# Patient Record
Sex: Male | Born: 1940 | Race: White | Hispanic: No | Marital: Married | Smoking: Former smoker
Health system: Southern US, Community
[De-identification: ages and names within clinical notes are randomized; demographics above are authoritative.]

## PROBLEM LIST (undated history)

## (undated) DIAGNOSIS — M1990 Unspecified osteoarthritis, unspecified site: Secondary | ICD-10-CM

## (undated) DIAGNOSIS — I714 Abdominal aortic aneurysm, without rupture, unspecified: Secondary | ICD-10-CM

## (undated) DIAGNOSIS — I2119 ST elevation (STEMI) myocardial infarction involving other coronary artery of inferior wall: Secondary | ICD-10-CM

## (undated) DIAGNOSIS — K6139 Other ischiorectal abscess: Secondary | ICD-10-CM

## (undated) DIAGNOSIS — N200 Calculus of kidney: Secondary | ICD-10-CM

## (undated) DIAGNOSIS — I2510 Atherosclerotic heart disease of native coronary artery without angina pectoris: Principal | ICD-10-CM

## (undated) DIAGNOSIS — Z9861 Coronary angioplasty status: Principal | ICD-10-CM

## (undated) DIAGNOSIS — E119 Type 2 diabetes mellitus without complications: Secondary | ICD-10-CM

## (undated) DIAGNOSIS — I10 Essential (primary) hypertension: Secondary | ICD-10-CM

## (undated) DIAGNOSIS — E785 Hyperlipidemia, unspecified: Secondary | ICD-10-CM

## (undated) DIAGNOSIS — G40909 Epilepsy, unspecified, not intractable, without status epilepticus: Secondary | ICD-10-CM

## (undated) HISTORY — DX: Epilepsy, unspecified, not intractable, without status epilepticus: G40.909

## (undated) HISTORY — DX: ST elevation (STEMI) myocardial infarction involving other coronary artery of inferior wall: I21.19

## (undated) HISTORY — DX: Atherosclerotic heart disease of native coronary artery without angina pectoris: I25.10

## (undated) HISTORY — DX: Coronary angioplasty status: Z98.61

## (undated) HISTORY — DX: Type 2 diabetes mellitus without complications: E11.9

## (undated) HISTORY — PX: ANAL FISSURE REPAIR: SHX2312

## (undated) HISTORY — DX: Essential (primary) hypertension: I10

---

## 2010-05-11 ENCOUNTER — Encounter: Admission: RE | Admit: 2010-05-11 | Discharge: 2010-05-11 | Payer: Self-pay | Admitting: Nurse Practitioner

## 2011-06-13 IMAGING — CR DG CHEST 2V
2 series · 2 of 2 positions shown · non-contrast
Comparison: None.

CLINICAL DATA: Cough, history diabetes

CHEST - 2 VIEW

[view not recorded (1 of 2)]
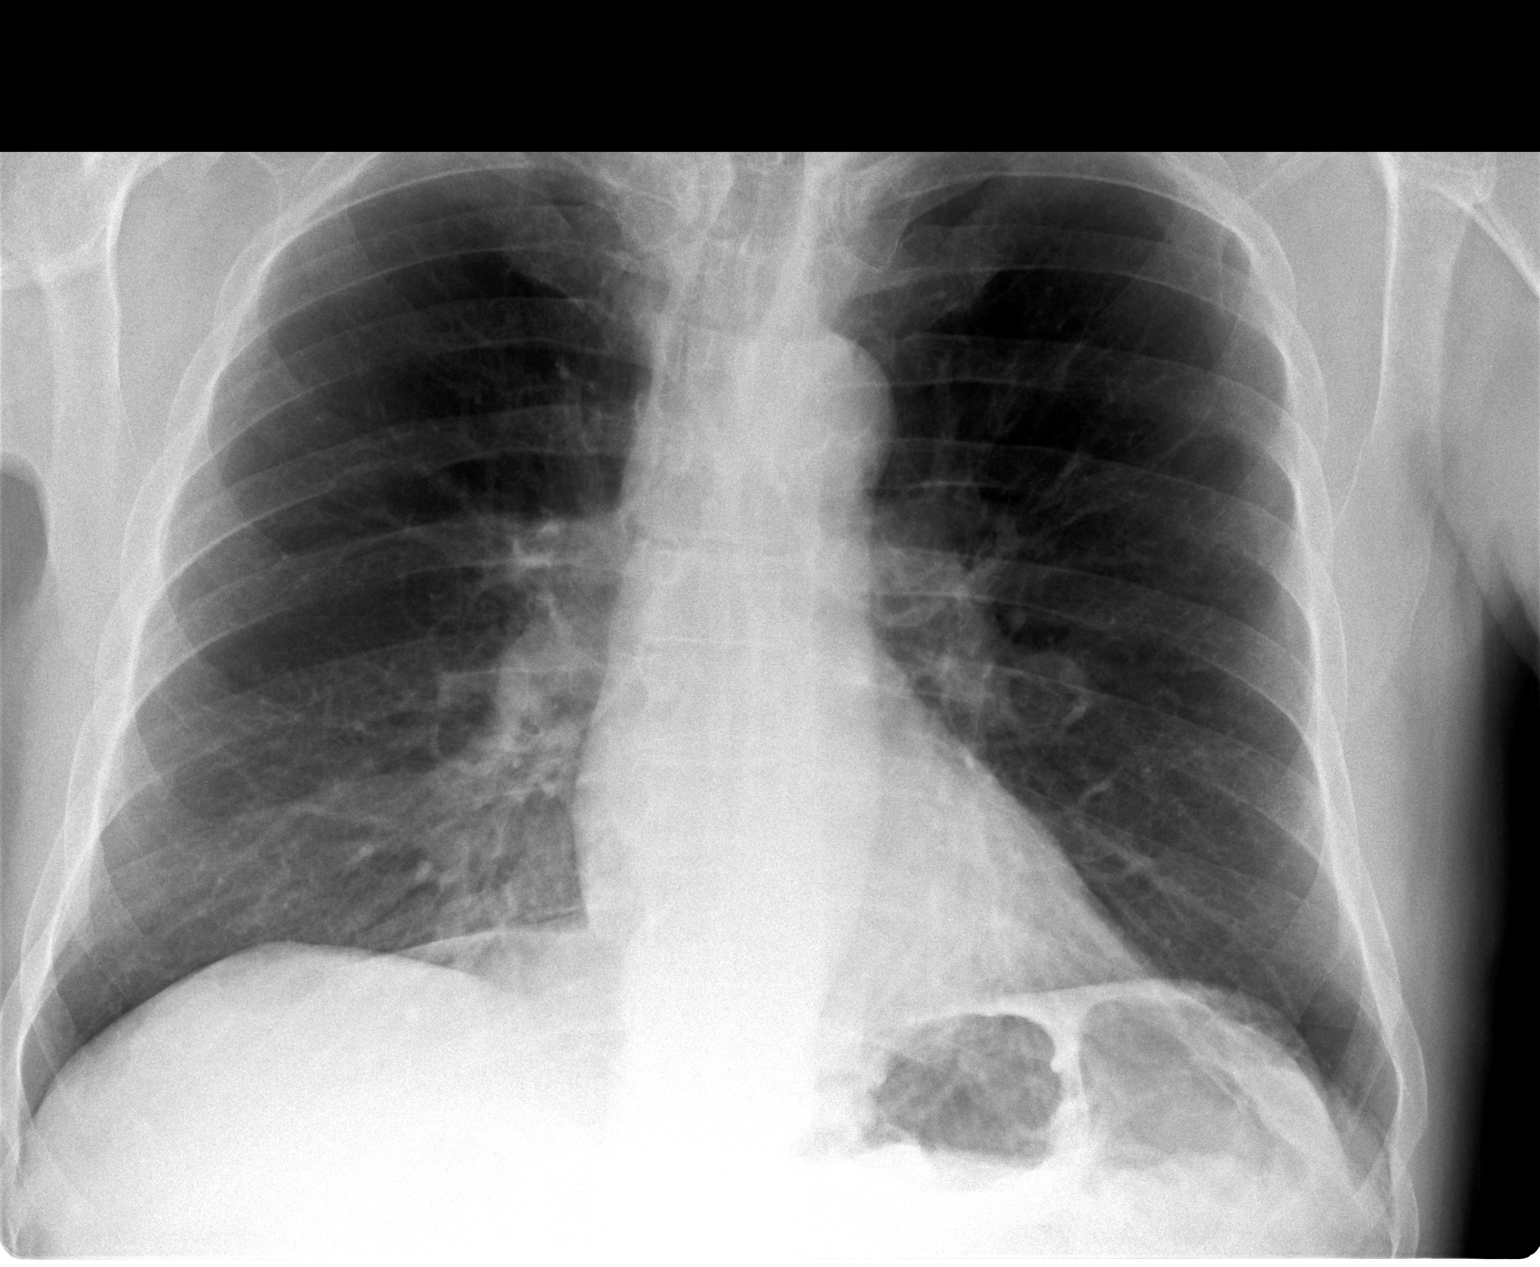

[view not recorded (2 of 2)]
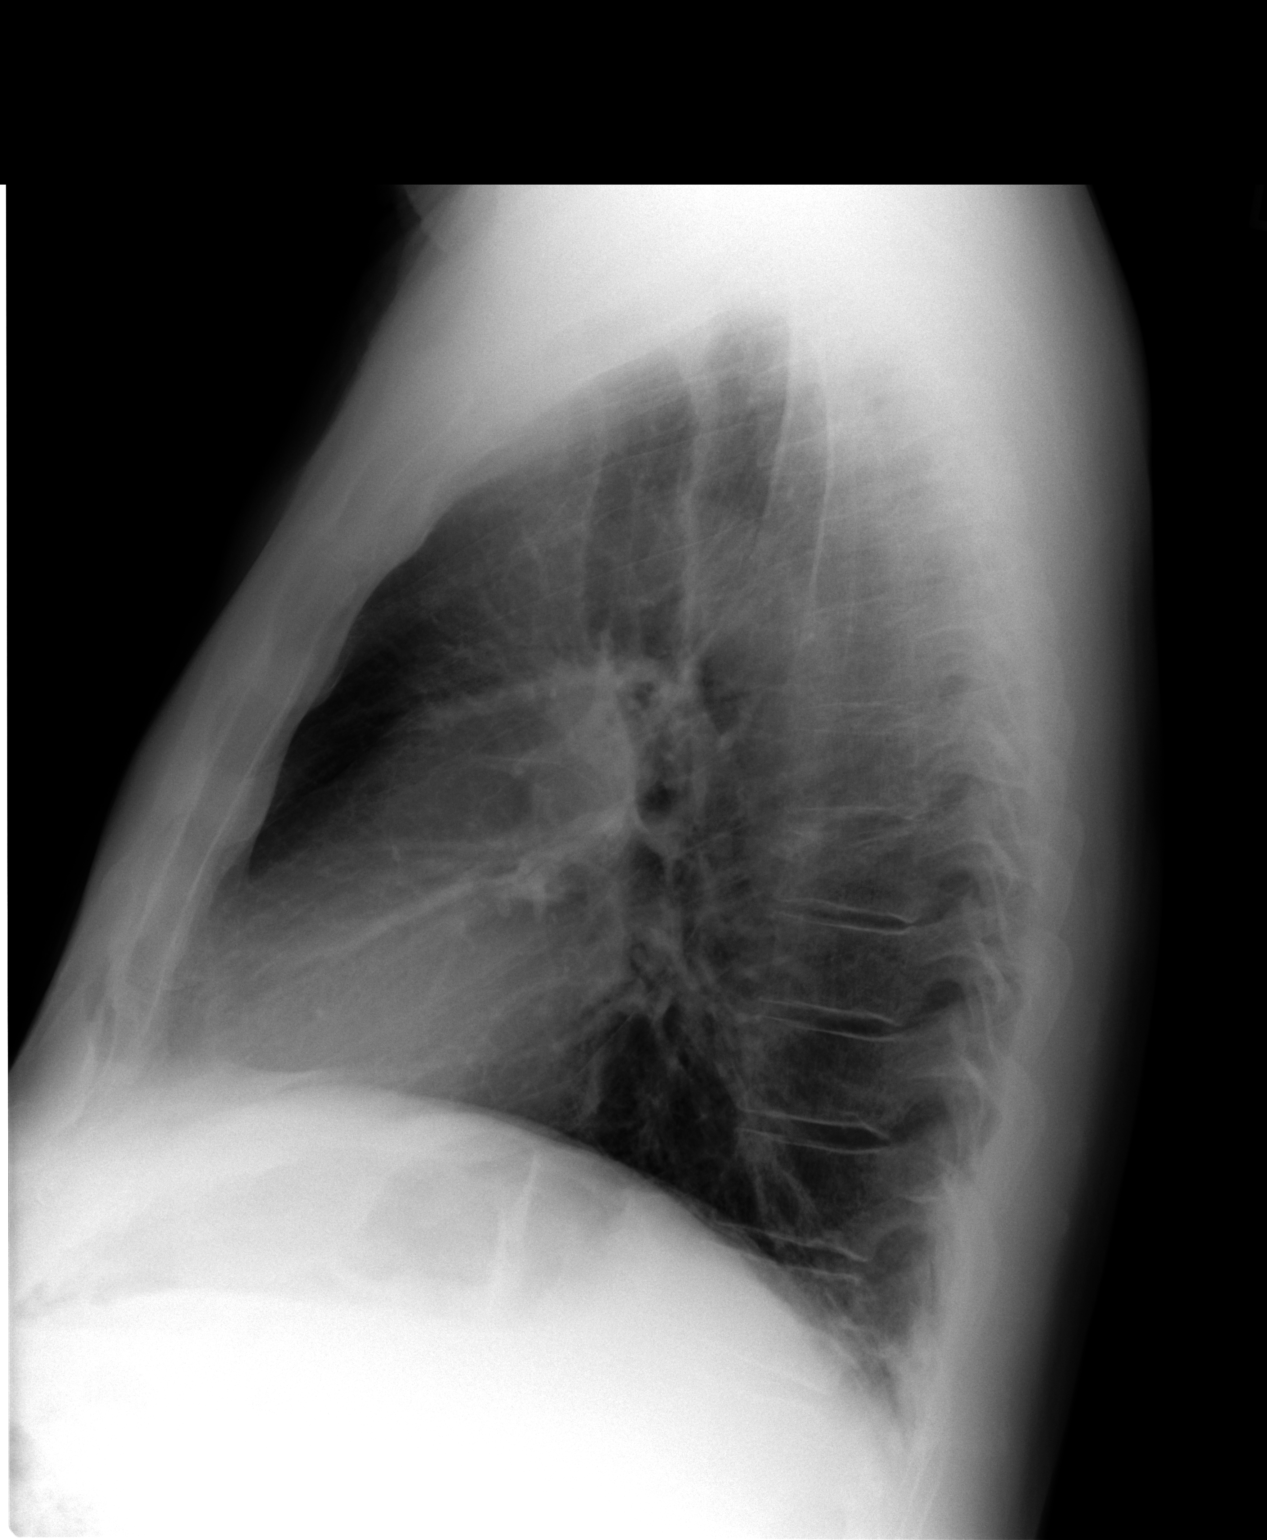

[2 of 2 positions shown; findings below may reference images not displayed]

FINDINGS: The lungs are clear.  Mediastinal contours are normal.
No bony abnormality is seen.
IMPRESSION: No active lung disease.

## 2013-11-24 ENCOUNTER — Inpatient Hospital Stay (HOSPITAL_COMMUNITY)
Admission: EM | Admit: 2013-11-24 | Discharge: 2013-11-27 | DRG: 247 | Disposition: A | Discharge status: Home / Self Care | Payer: Medicare Other | Attending: Cardiology | Admitting: Cardiology

## 2013-11-24 ENCOUNTER — Encounter (HOSPITAL_COMMUNITY): Payer: Self-pay | Admitting: Cardiology

## 2013-11-24 ENCOUNTER — Encounter (HOSPITAL_COMMUNITY)
Admission: EM | Disposition: A | Discharge status: Home / Self Care | Payer: Medicare Other | Source: Home / Self Care | Attending: Cardiology

## 2013-11-24 DIAGNOSIS — I4949 Other premature depolarization: Secondary | ICD-10-CM

## 2013-11-24 DIAGNOSIS — E119 Type 2 diabetes mellitus without complications: Secondary | ICD-10-CM

## 2013-11-24 DIAGNOSIS — Z7982 Long term (current) use of aspirin: Secondary | ICD-10-CM

## 2013-11-24 DIAGNOSIS — I2510 Atherosclerotic heart disease of native coronary artery without angina pectoris: Secondary | ICD-10-CM

## 2013-11-24 DIAGNOSIS — IMO0001 Reserved for inherently not codable concepts without codable children: Secondary | ICD-10-CM | POA: Diagnosis present

## 2013-11-24 DIAGNOSIS — I359 Nonrheumatic aortic valve disorder, unspecified: Secondary | ICD-10-CM

## 2013-11-24 DIAGNOSIS — R569 Unspecified convulsions: Secondary | ICD-10-CM | POA: Diagnosis present

## 2013-11-24 DIAGNOSIS — I493 Ventricular premature depolarization: Secondary | ICD-10-CM

## 2013-11-24 DIAGNOSIS — I2119 ST elevation (STEMI) myocardial infarction involving other coronary artery of inferior wall: Secondary | ICD-10-CM

## 2013-11-24 DIAGNOSIS — Z8249 Family history of ischemic heart disease and other diseases of the circulatory system: Secondary | ICD-10-CM

## 2013-11-24 DIAGNOSIS — Z87891 Personal history of nicotine dependence: Secondary | ICD-10-CM

## 2013-11-24 DIAGNOSIS — G40909 Epilepsy, unspecified, not intractable, without status epilepticus: Secondary | ICD-10-CM | POA: Diagnosis present

## 2013-11-24 DIAGNOSIS — E785 Hyperlipidemia, unspecified: Secondary | ICD-10-CM

## 2013-11-24 DIAGNOSIS — Z9861 Coronary angioplasty status: Secondary | ICD-10-CM

## 2013-11-24 DIAGNOSIS — Z79899 Other long term (current) drug therapy: Secondary | ICD-10-CM

## 2013-11-24 DIAGNOSIS — Z955 Presence of coronary angioplasty implant and graft: Secondary | ICD-10-CM

## 2013-11-24 DIAGNOSIS — E1165 Type 2 diabetes mellitus with hyperglycemia: Secondary | ICD-10-CM | POA: Diagnosis present

## 2013-11-24 DIAGNOSIS — I5189 Other ill-defined heart diseases: Secondary | ICD-10-CM | POA: Diagnosis present

## 2013-11-24 HISTORY — DX: Other ischiorectal abscess: K61.39

## 2013-11-24 HISTORY — DX: ST elevation (STEMI) myocardial infarction involving other coronary artery of inferior wall: I21.19

## 2013-11-24 HISTORY — DX: Calculus of kidney: N20.0

## 2013-11-24 HISTORY — PX: PERCUTANEOUS CORONARY STENT INTERVENTION (PCI-S): SHX6016

## 2013-11-24 HISTORY — DX: Coronary angioplasty status: Z98.61

## 2013-11-24 HISTORY — DX: Abdominal aortic aneurysm, without rupture, unspecified: I71.40

## 2013-11-24 HISTORY — DX: Atherosclerotic heart disease of native coronary artery without angina pectoris: I25.10

## 2013-11-24 HISTORY — PX: TRANSTHORACIC ECHOCARDIOGRAM: SHX275

## 2013-11-24 HISTORY — DX: Abdominal aortic aneurysm, without rupture: I71.4

## 2013-11-24 HISTORY — DX: Hyperlipidemia, unspecified: E78.5

## 2013-11-24 HISTORY — DX: Unspecified osteoarthritis, unspecified site: M19.90

## 2013-11-24 HISTORY — PX: CARDIAC CATHETERIZATION: SHX172

## 2013-11-24 HISTORY — PX: LEFT HEART CATHETERIZATION WITH CORONARY ANGIOGRAM: SHX5451

## 2013-11-24 LAB — MRSA PCR SCREENING: MRSA by PCR: NEGATIVE

## 2013-11-24 LAB — CBC WITH DIFFERENTIAL/PLATELET
Basophils Relative: 0 % (ref 0–1)
Eosinophils Absolute: 0.1 10*3/uL (ref 0.0–0.7)
Eosinophils Relative: 2 % (ref 0–5)
Lymphocytes Relative: 21 % (ref 12–46)
Monocytes Absolute: 0.9 10*3/uL (ref 0.1–1.0)
Monocytes Relative: 10 % (ref 3–12)
Neutro Abs: 5.9 10*3/uL (ref 1.7–7.7)
Platelets: 193 10*3/uL (ref 150–400)
RDW: 14.3 % (ref 11.5–15.5)

## 2013-11-24 LAB — POCT I-STAT, CHEM 8
BUN: 16 mg/dL (ref 6–23)
Calcium, Ion: 1.19 mmol/L (ref 1.13–1.30)
Glucose, Bld: 209 mg/dL — ABNORMAL HIGH (ref 70–99)
Potassium: 3.8 mEq/L (ref 3.5–5.1)
TCO2: 26 mmol/L (ref 0–100)

## 2013-11-24 LAB — COMPREHENSIVE METABOLIC PANEL
AST: 18 U/L (ref 0–37)
Alkaline Phosphatase: 107 U/L (ref 39–117)
CO2: 27 mEq/L (ref 19–32)
Chloride: 101 mEq/L (ref 96–112)
Creatinine, Ser: 0.75 mg/dL (ref 0.50–1.35)
Sodium: 139 mEq/L (ref 135–145)
Total Bilirubin: 0.2 mg/dL — ABNORMAL LOW (ref 0.3–1.2)
Total Protein: 6.9 g/dL (ref 6.0–8.3)

## 2013-11-24 LAB — LIPID PANEL
LDL Cholesterol: 118 mg/dL — ABNORMAL HIGH (ref 0–99)
Total CHOL/HDL Ratio: 5 RATIO

## 2013-11-24 LAB — TROPONIN I
Troponin I: 0.75 ng/mL (ref <0.30)
Troponin I: 13.63 ng/mL (ref <0.30)

## 2013-11-24 LAB — CK TOTAL AND CKMB (NOT AT ARMC): Total CK: 96 U/L (ref 7–232)

## 2013-11-24 SURGERY — LEFT HEART CATHETERIZATION WITH CORONARY ANGIOGRAM
Anesthesia: LOCAL

## 2013-11-24 MED ORDER — BIVALIRUDIN 250 MG IV SOLR
INTRAVENOUS | Status: AC
  Filled 2013-11-24: qty 250

## 2013-11-24 MED ORDER — HEPARIN SODIUM (PORCINE) 5000 UNIT/ML IJ SOLN
4000.0000 [IU] | Freq: Once | INTRAMUSCULAR | Status: AC
  Administered 2013-11-24: 4000 [IU] via INTRAVENOUS

## 2013-11-24 MED ORDER — ASPIRIN 81 MG PO CHEW
81.0000 mg | CHEWABLE_TABLET | Freq: Every day | ORAL | Status: DC
  Administered 2013-11-25 – 2013-11-27 (×3): 81 mg via ORAL
  Filled 2013-11-24 (×3): qty 1

## 2013-11-24 MED ORDER — PHENYTOIN SODIUM EXTENDED 100 MG PO CAPS
300.0000 mg | ORAL_CAPSULE | Freq: Every day | ORAL | Status: DC
  Administered 2013-11-24 – 2013-11-26 (×3): 300 mg via ORAL
  Filled 2013-11-24 (×4): qty 3

## 2013-11-24 MED ORDER — SODIUM CHLORIDE 0.9 % IJ SOLN: 3.0000 mL | INTRAMUSCULAR | Status: DC | PRN

## 2013-11-24 MED ORDER — TICAGRELOR 90 MG PO TABS
ORAL_TABLET | ORAL | Status: AC
  Filled 2013-11-24: qty 1

## 2013-11-24 MED ORDER — HEPARIN SODIUM (PORCINE) 5000 UNIT/ML IJ SOLN
INTRAMUSCULAR | Status: AC
  Filled 2013-11-24: qty 1

## 2013-11-24 MED ORDER — HEPARIN (PORCINE) IN NACL 2-0.9 UNIT/ML-% IJ SOLN
INTRAMUSCULAR | Status: AC
  Filled 2013-11-24: qty 1000

## 2013-11-24 MED ORDER — FENTANYL CITRATE 0.05 MG/ML IJ SOLN
INTRAMUSCULAR | Status: AC
  Filled 2013-11-24: qty 2

## 2013-11-24 MED ORDER — NITROGLYCERIN 0.2 MG/ML ON CALL CATH LAB
INTRAVENOUS | Status: AC
  Filled 2013-11-24: qty 1

## 2013-11-24 MED ORDER — VERAPAMIL HCL 2.5 MG/ML IV SOLN
INTRAVENOUS | Status: AC
  Filled 2013-11-24: qty 2

## 2013-11-24 MED ORDER — SODIUM CHLORIDE 0.9 % IV SOLN
INTRAVENOUS | Status: AC
  Administered 2013-11-24: 07:00:00 via INTRAVENOUS

## 2013-11-24 MED ORDER — MORPHINE SULFATE 2 MG/ML IJ SOLN
2.0000 mg | INTRAMUSCULAR | Status: DC | PRN
  Administered 2013-11-24: 2 mg via INTRAVENOUS
  Filled 2013-11-24: qty 1

## 2013-11-24 MED ORDER — METFORMIN HCL 500 MG PO TABS
1000.0000 mg | ORAL_TABLET | Freq: Two times a day (BID) | ORAL | Status: DC
  Administered 2013-11-26 – 2013-11-27 (×3): 1000 mg via ORAL
  Filled 2013-11-24 (×5): qty 2

## 2013-11-24 MED ORDER — TICAGRELOR 90 MG PO TABS
90.0000 mg | ORAL_TABLET | Freq: Two times a day (BID) | ORAL | Status: DC
  Administered 2013-11-24: 90 mg via ORAL
  Filled 2013-11-24 (×4): qty 1

## 2013-11-24 MED ORDER — SODIUM CHLORIDE 0.9 % IJ SOLN
3.0000 mL | Freq: Two times a day (BID) | INTRAMUSCULAR | Status: DC
  Administered 2013-11-24 – 2013-11-25 (×2): 3 mL via INTRAVENOUS

## 2013-11-24 MED ORDER — LIDOCAINE HCL (PF) 1 % IJ SOLN
INTRAMUSCULAR | Status: AC
  Filled 2013-11-24: qty 30

## 2013-11-24 MED ORDER — ATROPINE SULFATE 0.1 MG/ML IJ SOLN
INTRAMUSCULAR | Status: AC
  Filled 2013-11-24: qty 10

## 2013-11-24 MED ORDER — CEFAZOLIN SODIUM-DEXTROSE 2-3 GM-% IV SOLR
2.0000 g | Freq: Once | INTRAVENOUS | Status: DC
  Filled 2013-11-24: qty 50

## 2013-11-24 MED ORDER — HEPARIN SODIUM (PORCINE) 1000 UNIT/ML IJ SOLN
INTRAMUSCULAR | Status: AC
  Filled 2013-11-24: qty 1

## 2013-11-24 MED ORDER — HEPARIN SOD (PORK) LOCK FLUSH 100 UNIT/ML IV SOLN: 500.0000 [IU] | Freq: Once | INTRAVENOUS | Status: DC

## 2013-11-24 MED ORDER — ONDANSETRON HCL 4 MG/2ML IJ SOLN: 4.0000 mg | Freq: Four times a day (QID) | INTRAMUSCULAR | Status: DC | PRN

## 2013-11-24 MED ORDER — ACETAMINOPHEN 325 MG PO TABS: 650.0000 mg | ORAL_TABLET | ORAL | Status: DC | PRN

## 2013-11-24 MED ORDER — PHENYTOIN SODIUM EXTENDED 100 MG PO CAPS
200.0000 mg | ORAL_CAPSULE | Freq: Every day | ORAL | Status: DC
  Administered 2013-11-24 – 2013-11-27 (×4): 200 mg via ORAL
  Filled 2013-11-24 (×4): qty 2

## 2013-11-24 MED ORDER — SODIUM CHLORIDE 0.9 % IV SOLN: 250.0000 mL | INTRAVENOUS | Status: DC | PRN

## 2013-11-24 MED ORDER — METOPROLOL TARTRATE 25 MG PO TABS
25.0000 mg | ORAL_TABLET | Freq: Two times a day (BID) | ORAL | Status: DC
  Administered 2013-11-24 – 2013-11-26 (×5): 25 mg via ORAL
  Filled 2013-11-24 (×6): qty 1

## 2013-11-24 MED ORDER — ATORVASTATIN CALCIUM 80 MG PO TABS
80.0000 mg | ORAL_TABLET | Freq: Every day | ORAL | Status: DC
  Administered 2013-11-24 – 2013-11-26 (×3): 80 mg via ORAL
  Filled 2013-11-24 (×4): qty 1

## 2013-11-24 MED ORDER — MIDAZOLAM HCL 2 MG/2ML IJ SOLN
INTRAMUSCULAR | Status: AC
  Filled 2013-11-24: qty 2

## 2013-11-24 MED ORDER — ALUM & MAG HYDROXIDE-SIMETH 200-200-20 MG/5ML PO SUSP
30.0000 mL | ORAL | Status: DC | PRN
  Administered 2013-11-24: 30 mL via ORAL
  Filled 2013-11-24: qty 30

## 2013-11-24 NOTE — Interval H&P Note (Signed)
History and Physical Interval Note:  11/24/2013 6:32 AM  Conception Oms  has presented today for surgery, with the diagnosis of Inferior stemi  The various methods of treatment have been discussed with the patient and family. After consideration of risks, benefits and other options for treatment, the patient has consented to  Procedure(s): LEFT HEART CATHETERIZATION WITH CORONARY ANGIOGRAM (N/A)       with PCI as a surgical intervention .  The patient's history has been reviewed, patient examined, no change in status, stable for surgery.  I have reviewed the patient's chart and labs.  Questions were answered to the patient's satisfaction.     HARDING,DAVID W  Cath Lab Visit (complete for each Cath Lab visit)  Clinical Evaluation Leading to the Procedure:   ACS: yes  Non-ACS:    Anginal Classification: CCS IV  Anti-ischemic medical therapy: Minimal Therapy (1 class of medications)  Non-Invasive Test Results: No non-invasive testing performed  Prior CABG: No previous CABG

## 2013-11-24 NOTE — Progress Notes (Signed)
Utilization Review Completed.Marce Schartz T12/06/2013  

## 2013-11-24 NOTE — ED Notes (Signed)
Pt. Woke up this morning with substernal cp, and diaphoretic. Took 324 mg asa po; x 2 ntg sl. 0.4 mg. Hemodynamically stable upon arrival.  Alert and oriented upon arrival.

## 2013-11-24 NOTE — Progress Notes (Signed)
  Echocardiogram 2D Echocardiogram has been performed.  Melvin Morrow, Melvin Morrow 11/24/2013, 9:21 AM

## 2013-11-24 NOTE — ED Notes (Signed)
MD at bedside. 

## 2013-11-24 NOTE — Progress Notes (Signed)
Subjective: Still on bed rest. He denies any further CP. No SOB.   Objective: Vital signs in last 24 hours: Temp:  [98.2 F (36.8 C)] 98.2 F (36.8 C) (12/07 0700) Pulse Rate:  [35-90] 74 (12/07 1045) Resp:  [14-23] 14 (12/07 1045) BP: (89-137)/(44-82) 105/55 mmHg (12/07 1045) SpO2:  [96 %-100 %] 97 % (12/07 1045) Weight:  [186 lb 1.1 oz (84.4 kg)] 186 lb 1.1 oz (84.4 kg) (12/07 0700) Last BM Date: 11/24/13  Intake/Output from previous day:   Intake/Output this shift: Total I/O In: 215 [I.V.:215] Out: 600 [Urine:600]  Medications Current Facility-Administered Medications  Medication Dose Route Frequency Provider Last Rate Last Dose  . 0.9 %  sodium chloride infusion  250 mL Intravenous PRN Marykay Lex, MD      . 0.9 %  sodium chloride infusion   Intravenous Continuous Marykay Lex, MD 75 mL/hr at 11/24/13 [phone]    . acetaminophen (TYLENOL) tablet 650 mg  650 mg Oral Q4H PRN Marykay Lex, MD      . aspirin chewable tablet 81 mg  81 mg Oral Daily Marykay Lex, MD      . atorvastatin (LIPITOR) tablet 80 mg  80 mg Oral q1800 Marykay Lex, MD      . atropine 0.1 MG/ML injection           . ceFAZolin (ANCEF) IVPB 2 g/50 mL premix  2 g Intravenous Once Marykay Lex, MD      . heparin 5000 UNIT/ML injection           . metoprolol tartrate (LOPRESSOR) tablet 25 mg  25 mg Oral BID Marykay Lex, MD   25 mg at 11/24/13 [phone]  . morphine 2 MG/ML injection 2 mg  2 mg Intravenous Q1H PRN Marykay Lex, MD      . ondansetron [facility]) injection 4 mg  4 mg Intravenous Q6H PRN Marykay Lex, MD      . sodium chloride 0.9 % injection 3 mL  3 mL Intravenous Q12H Marykay Lex, MD      . sodium chloride 0.9 % injection 3 mL  3 mL Intravenous PRN Marykay Lex, MD      . Ticagrelor [facility]) tablet 90 mg  90 mg Oral BID Marykay Lex, MD        PE: General appearance: alert, cooperative and no distress Lungs: clear to auscultation bilaterally Heart: regular  rate and rhythm Extremities: no LEE Pulses: 2+ and symmetric Skin: warm and dry Neurologic: Grossly normal  Lab Results:   Recent Labs  11/24/13 0457 11/24/13 0518  WBC 8.7  --   HGB 13.3 13.6  HCT 39.1 40.0  PLT 193  --    BMET  Recent Labs  11/24/13 0457 11/24/13 0518  NA 139 141  K 4.1 3.8  CL 101 104  CO2 27  --   GLUCOSE 206* 209*  BUN 16 16  CREATININE 0.75 1.00  CALCIUM 9.3  --    PT/INR No results found for this basename: LABPROT, INR,  in the last 72 hours Cholesterol  Recent Labs  11/24/13 0457  CHOL 205*   Lipid Panel     Component Value Date/Time   CHOL 205* 11/24/2013 0457   TRIG 231* 11/24/2013 0457   HDL 41 11/24/2013 0457   CHOLHDL 5.0 11/24/2013 0457   VLDL 46* 11/24/2013 0457   LDLCALC 118* 11/24/2013 0457    Cardiac Panel (last 3  results)  Recent Labs  11/24/13 0457 11/24/13 0734  CKTOTAL 96  --   CKMB 7.7*  --   TROPONINI 0.75* 13.63*  RELINDX RELATIVE INDEX IS INVALID  --     Studies/Results  Emergent LHC + PCI 11/24/13 Hemodynamics:  Central Aortic / Mean Pressures: 121/59 mmHg; 88 mmHg  Left Ventricular Pressures / EDP: 122/7 mmHg; 14 mmHg Left Ventriculography:  EF: 60-65 %  Wall Motion: Distal/apical inferolateral hypokinesis Coronary Anatomy:  Left Main: Large-caliber short vessel that bifurcates into the LAD and Circumflex LAD: Large-caliber vessel that wraps the apex. There is mild intramyocardial bridging in the midportion that has the appearance of roughly 20% tubular stenosis. There is 1 major diagonal branches of the small in caliber with ostial 80% stenosis.  Left Circumflex: Large-caliber (larger than the LAD) codominant vessel it gives off a very small OM branch with an ostial 80% stenosis. Just after this branch as he goes into the AV groove there is a second marginal branch also small and diameter. At this takeoff which appeared to be posterior there is a 95-99% subtotal occlusion with a hazy filling defect  consistent with thrombus. The vessel then normalizes after the first bend were then bifurcates into a large lateral OM And the AV Groove Circumflex.  Lateral OM: Large-caliber vesselthat courses along the inferolateral wall all with the apex. Very distally it bifurcates in the superior branch which is larger the 2 there is a 95% filling defect.  AV Groove Circumflex: Bifurcates distally into 2 posterior lateral branches. RCA: Moderate caliber vessel with a tubular 30-40% mid vessel lesion. The vessel essentially terminates as the posterior descending artery giving off a small branch to the AV nodal. There is no significant right posterior lateral system.  RPDA: The vessel maintains moderate caliber tapering to small caliber vessel as it reaches almost to the apex. There diffuse luminal irregularities but no significant lesions.   Assessment/Plan  Principal Problem:   Acute MI, inferior wall, initial episode of care Active Problems:   ST elevation myocardial infarction (STEMI) of inferior wall -s/p PCI + DES to left circumflex   DM (diabetes mellitus)  Plan: S/P inferior wall STEMI, treated with PCI + DES to left circumflex. On DAPT with ASA + Brilinta. No further CP or SOB. HR and BP stable. PVCs on telemetry. On 25 mg Lopressor BID. Also on high dose Lipitor, 80 mg daily. 2D echo completed. Dr. Tresa Endo to read. Will continue to monitor in CCU.     LOS: 0 days    Brittainy M. Sharol Harness, PA-C 11/24/2013 11:11 AM   Echo Study Conclusions  - Left ventricle: The cavity size was normal. Wall thickness was normal. Systolic function was normal. The estimated ejection fraction was in the range of 55% to 60%. Probable mild hypokinesis of the mid-distal inferolateral myocardium. Doppler parameters are consistent with abnormal left ventricular relaxation (grade 1 diastolic dysfunction). Doppler parameters are consistent with elevated mean left atrial filling pressure. - Aortic valve: Mildly  calcified annulus. Mildly thickened leaflets. - Mitral valve: Mildly calcified annulus. Mildly thickened leaflets . Mild regurgitation. - Left atrium: The atrium was mildly dilated. - Right ventricle: Systolic pressure was increased. - Atrial septum: No defect or patent foramen ovale was identified. - Tricuspid valve: Mild regurgitation. - Pulmonary arteries: PA peak pressure: 32mm Hg (S). Impressions:  - The right ventricular systolic pressure was increased consistent with mild pulmonary hypertension.  Patient seen and examined. Agree with assessment and plan. Cines and echo reviewed. Large  co-dominant LCx with subtotal thrombus laden occlusion with distal inferolateral wall motion abnormality at cath corroborated on echo assessment of LV function. Anticipate good recovery of myocardium since vessel was not completely occluded.  No further chest pain, but PVC's on telemetry. Will start lopressor 25 mg bid today and titrate . Will add ace-i tomorrow. R radial and groin sites are stable. Lipid panel with atherogenic dyslipidemic pattern with inc TG and VLDL, suspect a preponderance of small LDL particle and sig increase in LDL particle number; now on lipitor 80 mg. DAPT with ASA/Brilinta.   Lennette Bihari, MD, [facility] 11/24/2013 11:39 AM time 40 minutes

## 2013-11-24 NOTE — ED Notes (Signed)
Family at bedside. 

## 2013-11-24 NOTE — H&P (Signed)
CARDIOLOGY ADMISSION NOTE  Patient ID: Melvin Morrow MRN: [id number] DOB/AGE: December 23, 1940 72 y.o.  Admit date: 11/24/2013 Primary Physician   Dr. Lindi Adie Primary Cardiologist   None Chief Complaint    Chest pain  HPI:  The patient presents as a STEMI with inferior ST elevation.  He has had no prior cardiac history.  He does have diabetes. He woke at 3 am.  He apparently had diarrhea.  He developed chest pain.  EMS was called EKG at 0430 demonstrated mild inferior ST elevation.  He was treated with SLNTG.  On transfer to the ED he had almost complete resolution of the pain.  The pain had been severe, sharp and in the left chest.  He did reported that he was diaphoretic with nausea and some dyspnea.  He did report some dark stool recently.  He has had some mild streaking of red blood on his toilet paper.  He did have anal fissure surgery recently.     Past Medical History  Diagnosis Date  . Diabetes   . Arthritis   . Seizure     Distant past    Past Surgical History  Procedure Laterality Date  . Anal fissure repair      Allergies:  Sulfa  Meds: Alprazolam  0.25 mg prn bid ASA 81 mg po daily Fluticasone 50 mcg spray 2 sprays per nostril bid Glyburide-metfromin 5-500 2 tablets bid Lovastatin 40 mg qhs Metoprolol 25 mg bid Omeprazole 20 mg daily Phenytoin 100 mg ER daily   History   Social History  . Marital Status: Married    Spouse Name: N/A    Number of Children: N/A  . Years of Education: N/A   Occupational History  . Not on file.   Social History Main Topics  . Smoking status: Former Smoker    Types: Cigarettes  . Smokeless tobacco: Not on file     Comment: Quit 25 years ago.   . Alcohol Use: Not on file  . Drug Use: Not on file  . Sexual Activity: Not on file   Other Topics Concern  . Not on file   Social History Narrative  . No narrative on file    Family History  Problem Relation Age of Onset  . CAD Father      ROS:  As stated in the HPI and  negative for all other systems.  Physical Exam: HR:  90,  BP 130/70,  96% (2 liters) GENERAL:  Well appearing HEENT:  Pupils equal round and reactive, fundi not visualized, oral mucosa unremarkable NECK:  No jugular venous distention, waveform within normal limits, carotid upstroke brisk and symmetric, no bruits, no thyromegaly LYMPHATICS:  No cervical, inguinal adenopathy LUNGS:  Clear to auscultation bilaterally BACK:  No CVA tenderness CHEST:  Unremarkable HEART:  PMI not displaced or sustained,S1 and S2 within normal limits, no S3, no S4, no clicks, no rubs, no murmurs ABD:  Flat, positive bowel sounds normal in frequency in pitch, no bruits, no rebound, no guarding, no midline pulsatile mass, no hepatomegaly, no splenomegaly EXT:  2 plus pulses upper, decreased DP/PT bilaterally, trace ankle edema, no cyanosis no clubbing SKIN:  No rashes no nodules NEURO:  Cranial nerves II through XII grossly intact, motor grossly intact throughout PSYCH:  Cognitively intact, oriented to person place and time   Labs:   Pending    Radiology:  CXR:  Pending  EKG:  NSR, frequent PACs, axis WNL, inferior and lateral  3 mm ST elevation  II, III, aVF, V4, V5, with reciprocal ST depression in the anterior leads.    ASSESSMENT AND PLAN:    ACUTE INFERIOR MI:  The patient is taken directly to the cath lab.  The patient understands that risks included but are not limited to stroke (1 in 1000), death (1 in 1000), kidney failure [usually temporary] (1 in 500), bleeding (1 in 200), allergic reaction [possibly serious] (1 in 200).  The patient understands and agrees to proceed.   DM:  Hold metfromin.  He will need sliding scall insulin.  Check A1C  HYPERLIPIDEMIA:  Substitute Lipitor 80 mg.  Fasting lipids sent.   SignedRollene Rotunda 11/24/2013, 5:18 AM

## 2013-11-24 NOTE — CV Procedure (Addendum)
CARDIAC CATHETERIZATION AND PERCUTANEOUS CORONARY INTERVENTION REPORT  NAME:  Melvin Morrow   MRN: [id number] DOB:  December 27, 1940   ADMIT DATE: 11/24/2013 Procedure Date: 11/24/2013  INTERVENTIONAL CARDIOLOGIST: Marykay Lex, M.D., MS PRIMARY CARE PROVIDER: No primary provider on file. PRIMARY CARDIOLOGIST: None  PATIENT:  Melvin Morrow is a 72 y.o. male transfer to [facility] emergency room via EMS for inferior STEMI. The patient awoke at 3:00 a.m. with severe substernal chest pressure and tightness and shortness of breath associated with nausea and dyspnea as well as diaphoresis.. His 2 sons works for the [employer] and 90 cold and they called EMS. Upon arrival ECG showed 2-3 mm elevations in the inferior and lateral leads with anteroseptal ST depressions. On initial read of the EMS in the field, there was concern for possible posterior MI. A right and posterior ECG was checked confirming that there was not evidence of posterior infarct. The patient has had a recent history of a anal fissure her surgery. He has a history of diabetes. He has noted some mild streaking of red blood on toilet paper but nothing significant. He also noted some dark stool recently but no frank bleeding.  PRE-OPERATIVE DIAGNOSIS:    Inferiorolateral STEMI  PROCEDURES PERFORMED:    LEFT HEART CATHETERIZATION WITH CORONARY ANGIOGRAPY  PERCUTANEOUS CORONARY INTERVENTION ON ~PROXIMAL CO-DOMINANT CIRCUMFLEX WITH XIENCE XPEDITION DES 3.5 MM X 23 MM (3.75 MM)  PROCEDURE:Consent:  Risks of procedure as well as the alternatives and risks of each were explained to the (patient/caregiver).  Verbal consent for procedure obtained as we were unable to obtain consent because of emergent medical necessity.  PROCEDURE: The patient was brought to the 2nd Floor [facility] in the fasting state and prepped and draped in the usual sterile fashion for Right groin or radial access. A modified  Allen's test with plethysmography was performed, revealing excellent Ulnar artery collateral flow.  Sterile technique was used including antiseptics, cap, gloves, gown, hand hygiene, mask and sheet.  Skin prep: Chlorhexidine.  Time Out: Verified patient identification, verified procedure, site/side was marked, verified correct patient position, special equipment/implants available, medications/allergies/relevent history reviewed, required imaging and test results available.  Performed  Access:   Initial attempt terminating at the Right Radial Artery, however despite excellent arterial flow after arterial puncture, the micropuncture wire was unable to be passed through safely into the vessel. After several attempts, the decision was made to abandon radial access then switch to femoral artery access. The wrist was with adequate hemostasis.  Right Common Femoral Artery; 6 Fr Sheath -- fluoroscopically guided modified Seldinger technique   Immediately following successful access, as the initial catheter was being advanced, the X-ray system failed.  Despite efforts by the staff to resolve the issues, the system would not function.  Therefore, the decision was made to transfer rooms. Careful attention was taken to maintain sterility of the equipment, access site and the operative field while the patient was transferred from the Cath Lab table to the stretcher.  The sheath was maintained with a Tegaderm dressing He was then transferred from room 6 to room 5, then transferred onto the table. He was then reprepped and draped.  The existing sheath was changed out for a new 6 French sheath to maintain sterility. New gloves were donned and the diagnostic procedure was commenced.  2 g IV Ancef was administered for precaution.  Diagnostic Left Heart Catheterization and Coronary Angiography:  JL 4, JR 4, Angled Pigtail  Left  Coronary Artery Angiography: JL4  Right Coronary Artery Angiography: JR4  LV  Hemodynamics (LV Gram): Angled Pigtail (post PCI)  Sheath:  Sutured in place, to be removed 2 hr after d/c of Angiomax Hours,   MEDICATIONS:  Anesthesia:  Local Lidocaine 2 mL for radial access, 18 mL for femoral access  Sedation:  2 mg IV Versed, 75 mcg IV fentanyl ;   Premedication: He had been given 320 mg of oral aspirin by EMS, 4000 units of IV heparin in the ER.  Omnipaque Contrast: 160 mL  Anticoagulation: Angiomax Bolus & drip - discontinued upon completion of the procedure  Anti-Platelet Agent:  Brilinta 100 mg   Ancef: 2g - given for precaution due to the need for changing tables with sheath in place.  Hemodynamics:  Central Aortic / Mean Pressures: 121/59 mmHg; 88 mmHg  Left Ventricular Pressures / EDP: 122/7 mmHg; 14 mmHg  Left Ventriculography:  EF: 60-65 %  Wall Motion: Distal/apical inferolateral hypokinesis  Coronary Anatomy:  Left Main: Large-caliber short vessel that bifurcates into the LAD and Circumflex LAD: Large-caliber vessel that wraps the apex. There is mild intramyocardial bridging in the midportion that has the appearance of roughly 20% tubular stenosis. There is 1 major diagonal branches of the small in caliber with ostial 80% stenosis.  Left Circumflex: Large-caliber (larger than the LAD) codominant vessel it gives off a very small OM branch with an ostial 80% stenosis. Just after this branch as he goes into the AV groove there is a second marginal branch also small and diameter. At this takeoff which appeared to be posterior there is a 95-99% subtotal occlusion with a hazy filling defect consistent with thrombus. The vessel then normalizes after the first bend were then bifurcates into a large lateral OM And the AV Groove Circumflex.   Lateral OM: Large-caliber vesselthat courses along the inferolateral wall all with the apex. Very distally it bifurcates in the superior branch which is larger the 2 there is a 95% filling defect.  AV Groove  Circumflex: Bifurcates distally into 2 posterior lateral branches.   RCA: Moderate caliber vessel with a tubular 30-40% mid vessel lesion. The vessel essentially terminates as the posterior descending artery giving off a small branch to the AV nodal. There is no significant right posterior lateral system.    RPDA: The vessel maintains moderate caliber tapering to small caliber vessel as it reaches almost to the apex. There diffuse luminal irregularities but no significant lesions.  Reviewing the initial angiographic data with clearly suggested that the circumflex lesion was the culprit for the inferior STEMI, thankfully the patient  remained chest pain-free from just after the heparin bolus was given in the emergency room. This was consistent with at least partial reperfusion prior to initiation of the catheterization procedure. Preparations were then made to intervene on the technically mid yet physiologically proximal Circumflex.  Percutaneous Coronary Intervention:   Lesion: Early mid codominant circumflex 95-99% subtotal thrombotic stenosis; reduced to 0%;    TIMI-2 flow pre-, TIMI-3 flow post Guide: 6 Fr   XB 3.5 Guidewire: BMW Predilation Balloon: Emerge Monorail 2.5 mm x 15 mm; 5:57 AM  8 Atm x 30 Sec Stent: Xience Xpedition DES 3.5 mm x 23 mm;   Deployed at: 12 Atm x 30 Sec,   Postdilated with stent balloon:  16 Atm x 45 Sec - final diameter: 2.75 mm Post-dilation Balloon: [place] Emerge 3.75 mm x 15 mm; proximal two thirds of stent   16 Atm x 45 Sec,  Final Diameter ~3.8 mm proximal  Post deployment angiography in multiple views, with and without guidewire in place revealed excellent stent deployment and lesion coverage.  There was no evidence of dissection or perforation.  Door to balloon time was delayed due to 2 the initial difficulty with radial access do to radial spasm. This led to the need for converting to femoral approach. No more than 5 minutes was spent attending radial  access before switching to the femoral approach. This however did not really been the significant reason for delay as it the x-ray system shutting down shortly after femoral access with clearly a cause delay regardless of access. The patient arrived to [facility] emergency room at 4:51 AM, was taken to the Cath Lab at 4:57 AM. Despite the unexpected delays, the first balloon inflation was at 5:57 AM Hetty Ely balloon time of roughly 66 minutes.   PATIENT DISPOSITION:    The patient was transferred to the PACU holding area in a hemodynamicaly stable, chest pain free condition.  The patient tolerated the procedure well, and there were no complications.  EBL:   < 20 ml  The patient was stable before, during, and after the procedure.  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 There are small branch vessels including a diagonal and a very proximal small OM with ostial stenoses.  Successful PCI of the circumflex lesion with a Xience expedition DES 3.5 mm x 20 mm (3.8 mm proximal and 3.75 mm vessel)  There is some residual stenoses very distal down the superior branch of the lateral OM that had significantly improved flow post PCI.  Well-preserved EF with minimally elevated LVEDP, distal inferolateral hypokinesis as expected.  PLAN OF CARE:  Patient will be admitted to the CCU for post catheterization care.  As he is relatively stable hemodynamic standpoint with no active symptoms, we could consider early versus fast-track discharge in 3 days.  Add beta blocker and statin.  Check 2-D Echocardiogram to confirm wall motion changes.   Marykay Lex, M.D., M.S. [facility] [address] [place], [place]  [address]  [phone]  11/24/2013 6:34 AM

## 2013-11-24 NOTE — Progress Notes (Signed)
Right femoral sheath pulled at 0945. Manual pressure held x . No blood loss noted. Patient tolerated procedure well. Vitals remained stable. Right groin remained level 0 post hemostasis. Occlusive pressure dsg applied. Patient instructed to apply pressure to groin with cough and/or sneeze and to notify RN if bleeding occurs. Patient also instructed to remain on bedrest, not to raise head or bend leg until notification by RN. RN will continue to check on site frequently until straight-leg time is discontinued.

## 2013-11-25 ENCOUNTER — Encounter (HOSPITAL_COMMUNITY): Payer: Self-pay

## 2013-11-25 DIAGNOSIS — I2119 ST elevation (STEMI) myocardial infarction involving other coronary artery of inferior wall: Principal | ICD-10-CM

## 2013-11-25 DIAGNOSIS — Z9861 Coronary angioplasty status: Secondary | ICD-10-CM

## 2013-11-25 DIAGNOSIS — E119 Type 2 diabetes mellitus without complications: Secondary | ICD-10-CM

## 2013-11-25 DIAGNOSIS — I2510 Atherosclerotic heart disease of native coronary artery without angina pectoris: Secondary | ICD-10-CM

## 2013-11-25 LAB — CBC
HCT: 35.9 % — ABNORMAL LOW (ref 39.0–52.0)
Hemoglobin: 12.3 g/dL — ABNORMAL LOW (ref 13.0–17.0)
MCH: 31.4 pg (ref 26.0–34.0)
MCV: 91.6 fL (ref 78.0–100.0)
Platelets: 180 10*3/uL (ref 150–400)
RBC: 3.92 MIL/uL — ABNORMAL LOW (ref 4.22–5.81)
RDW: 14.4 % (ref 11.5–15.5)
WBC: 7 10*3/uL (ref 4.0–10.5)

## 2013-11-25 LAB — POCT ACTIVATED CLOTTING TIME: Activated Clotting Time: 340 seconds

## 2013-11-25 LAB — BASIC METABOLIC PANEL
CO2: 26 mEq/L (ref 19–32)
Calcium: 8.5 mg/dL (ref 8.4–10.5)
Chloride: 102 mEq/L (ref 96–112)
Creatinine, Ser: 0.68 mg/dL (ref 0.50–1.35)
GFR calc Af Amer: >90 mL/min (ref >90)
Glucose, Bld: 228 mg/dL — ABNORMAL HIGH (ref 70–99)
Sodium: 135 mEq/L (ref 135–145)

## 2013-11-25 LAB — TROPONIN I
Troponin I: >20 ng/mL (ref <0.30)
Troponin I: 17.86 ng/mL (ref <0.30)

## 2013-11-25 MED ORDER — PRASUGREL HCL 10 MG PO TABS
10.0000 mg | ORAL_TABLET | Freq: Every day | ORAL | Status: DC
  Administered 2013-11-25 – 2013-11-27 (×3): 10 mg via ORAL
  Filled 2013-11-25 (×3): qty 1

## 2013-11-25 MED FILL — Sodium Chloride IV Soln 0.9%: INTRAVENOUS | Qty: 50 | Status: AC

## 2013-11-25 NOTE — Care Management Note (Addendum)
    Page 1 of 1   11/26/2013     12:20:58 PM   CARE MANAGEMENT NOTE 11/26/2013  Patient:  Melvin Morrow, Melvin Morrow   Account Number:  [id number]  Date Initiated:  11/24/2013  Documentation initiated by:  [facility]  Subjective/Objective Assessment:   adm: severe substernal chest pressure and tightness and shortness of breath:inferior STEMI     Action/Plan:   discharge planning   Anticipated DC Date:     Anticipated DC Plan:        DC Planning Services  CM consult  Medication Assistance      Choice offered to / List presented to:             Status of service:  Completed, signed off Medicare Important Message given?   (If response is "NO", the following Medicare IM given date fields will be blank) Date Medicare IM given:   Date Additional Medicare IM given:    Discharge Disposition:  HOME/SELF CARE  Per UR Regulation:  Reviewed for med. necessity/level of care/duration of stay  If discussed at Long Length of Stay Meetings, dates discussed:    Comments:  11-26-13 1216 Tomi Bamberger, RN,BSN [phone] CM did call [facility] and medication is  not available. Will order and will be in on Wednesday. Co pay will be 143.13. No further needs form Cm at this time.   12/8  0944 debbie dowell rn,bsn pt changed to effient. effient 30day free and copay assist card left in room. in donut hole. has 47.5%of cost of effient out of pocket.  11/24/13 09:05 CM met with pt in room and asked if he has a PCP.  Pt states he is regularly seen at [facility] but cannot remember the name of his PCP.  CM gave pt Brilinta book and RN in room and states she is not sure that he will be prescribed this medication but will pass on to following RNs to activate if needed.  CM will continue to monitor for discharge needs.  Freddy Jaksch, BSN, CM [phone].

## 2013-11-25 NOTE — Progress Notes (Signed)
CARDIAC REHAB PHASE I   PRE:  Rate/Rhythm: 85SR  BP:  Supine:   Sitting: 112/57  Standing:    SaO2: 98%RA  MODE:  Ambulation: 700 ft   POST:  Rate/Rhythm: 106ST  BP:  Supine:   Sitting: 127/84  Standing:    SaO2: 98%RA 0850-0942 Pt walked 700 ft on RA with asst x 1 with steady gait. Denied dizziness or CP. Stable to transfer to tele. To recliner after walk with call bell. Started ed with pt. He is very talkative and hard to redirect for ed. Reviewed NTG use, MI restrictions, CRP 2 and effient/stent. Will complete ed tomorrow in wife's presence as pt needs some re enforcement. Discussed CRP 2 and pt gave permission to refer to [place] program. Gave effient packet and discussed use with pt.    Luetta Nutting, RN BSN  11/25/2013 9:37 AM

## 2013-11-25 NOTE — Progress Notes (Signed)
Subjective: Feels great. Out of bed and in a chair. Denies any chest pain or SOB.   Objective: Vital signs in last 24 hours: Temp:  [98 F (36.7 C)-98.8 F (37.1 C)] 98 F (36.7 C) (12/08 0400) Pulse Rate:  [35-104] 62 (12/08 0100) Resp:  [13-29] 19 (12/08 0100) BP: (89-132)/(29-75) 104/40 mmHg (12/07 2338) SpO2:  [93 %-100 %] 95 % (12/08 0100) Last BM Date: 11/24/13  Intake/Output from previous day: 12/07 0701 - 12/08 0700 In: 825 [P.O.:235; I.V.:590] Out: 1600 [Urine:1600] Intake/Output this shift:    Medications Current Facility-Administered Medications  Medication Dose Route Frequency Provider Last Rate Last Dose  . 0.9 %  sodium chloride infusion  250 mL Intravenous PRN Marykay Lex, MD      . acetaminophen (TYLENOL) tablet 650 mg  650 mg Oral Q4H PRN Marykay Lex, MD      . alum & mag hydroxide-simeth (MAALOX/MYLANTA) 200-200-20 MG/5ML suspension 30 mL  30 mL Oral PRN Marykay Lex, MD   30 mL at 11/24/13 1644  . aspirin chewable tablet 81 mg  81 mg Oral Daily Marykay Lex, MD      . atorvastatin (LIPITOR) tablet 80 mg  80 mg Oral q1800 Marykay Lex, MD   80 mg at 11/24/13 1719  . ceFAZolin (ANCEF) IVPB 2 g/50 mL premix  2 g Intravenous Once Marykay Lex, MD      . Melene Muller ON 11/26/2013] metFORMIN (GLUCOPHAGE) tablet 1,000 mg  1,000 mg Oral BID WC Brittainy Simmons, PA-C      . metoprolol tartrate (LOPRESSOR) tablet 25 mg  25 mg Oral BID Marykay Lex, MD   25 mg at 11/24/13 2141  . morphine 2 MG/ML injection 2 mg  2 mg Intravenous Q1H PRN Marykay Lex, MD   2 mg at 11/24/13 1646  . ondansetron (ZOFRAN) injection 4 mg  4 mg Intravenous Q6H PRN Marykay Lex, MD      . phenytoin (DILANTIN) ER capsule 200 mg  200 mg Oral Daily Brittainy Simmons, PA-C   200 mg at 11/24/13 1507  . phenytoin (DILANTIN) ER capsule 300 mg  300 mg Oral QHS Marykay Lex, MD   300 mg at 11/24/13 2133  . prasugrel (EFFIENT) tablet 10 mg  10 mg Oral Daily Marykay Lex,  MD      . sodium chloride 0.9 % injection 3 mL  3 mL Intravenous Q12H Marykay Lex, MD   3 mL at 11/24/13 2132  . sodium chloride 0.9 % injection 3 mL  3 mL Intravenous PRN Marykay Lex, MD        PE: General appearance: alert, cooperative and no distress Lungs: clear to auscultation bilaterally Heart: regular rate and rhythm Extremities: no LEE Pulses: 2+ and symmetric Skin: warm and dry Neurologic: Grossly normal  Lab Results:   Recent Labs  11/24/13 0457 11/24/13 0518 11/25/13 0437  WBC 8.7  --  7.0  HGB 13.3 13.6 12.3*  HCT 39.1 40.0 35.9*  PLT 193  --  180   BMET  Recent Labs  11/24/13 0457 11/24/13 0518 11/25/13 0437  NA 139 141 135  K 4.1 3.8 4.4  CL 101 104 102  CO2 27  --  26  GLUCOSE 206* 209* 228*  BUN 16 16 10   CREATININE 0.75 1.00 0.68  CALCIUM 9.3  --  8.5   PT/INR No results found for this basename: LABPROT, INR,  in the last 72  hours Cholesterol  Recent Labs  11/24/13 0457  CHOL 205*   Cardiac Panel (last 3 results)  Recent Labs  11/24/13 0457 11/24/13 0734 11/24/13 1313 11/24/13 1945  CKTOTAL 96  --   --   --   CKMB 7.7*  --   --   --   TROPONINI 0.75* 13.63* >25.00* >20.00*  RELINDX RELATIVE INDEX IS INVALID  --   --   --      Studies/Results:  Post STEMI 2D echo 11/24/13 Study Conclusions  - Left ventricle: The cavity size was normal. Wall thickness was normal. Systolic function was normal. The estimated ejection fraction was in the range of 55% to 60%. Probable mild hypokinesis of the mid-distalinferolateral myocardium. Doppler parameters are consistent with abnormal left ventricular relaxation (grade 1 diastolic dysfunction). Doppler parameters are consistent with elevated mean left atrial filling pressure. - Aortic valve: Mildly calcified annulus. Mildly thickened leaflets. - Mitral valve: Mildly calcified annulus. Mildly thickened leaflets . Mild regurgitation. - Left atrium: The atrium was mildly  dilated. - Right ventricle: Systolic pressure was increased. - Atrial septum: No defect or patent foramen ovale was identified. - Tricuspid valve: Mild regurgitation. - Pulmonary arteries: PA peak pressure: 32mm Hg (S). Impressions:  - The right ventricular systolic pressure was increased consistent with mild pulmonary hypertension.   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 DES to left circumflex. No further CP or SOB. Continue DAPT with ASA + Brilinta. HR and BP both stable. SR on telemetry with occasional PVCs. Will continue 25 mg Lopressor BID. Also continue on statin. ? Adding a low dose ACE-I today. 2D echo yesterday revealed normal systolic function with an EF of 55-60% and probable mild hypokinesis of the mid-distalinferolateral myocardium. Will recheck a troponin today to ensure levels are trending below 20. Will ask cardiac rehab to assess. MD to follow.    LOS: 1 day    Brittainy M. Delmer Islam 11/25/2013 7:51 AM  I have seen and evaluated the patient this AM along with Boyce Medici, PA. I agree with her findings, examination as well as impression recommendations.  Progressing well after 1st full day post STEMI.  BP stable, but would not introduce ACE-I as yet with normal EF.  Since he has PVCs, would prefer to titrate BB dose first.  Has only had 3 doses so far, too soon to uptitrate.  On statin. & DAPT -- converted to Effient from Brilinta due to interaction with Dilantin. Ambulate today with [facility] & anticipate transfer to Tele this PM - d/c as early as tomorrow AM if  remains stable. Phase 2 [facility] c/s as OP. CM consulted for Effient.   Marykay Lex, M.D., M.S. [facility] [address] [place], [place]  [address]  [phone] Pager # [phone] 11/25/2013 8:43 AM

## 2013-11-25 NOTE — Progress Notes (Signed)
Inpatient Diabetes Program Recommendations  AACE/ADA: New Consensus Statement on Inpatient Glycemic Control (2013)  Target Ranges:  Prepandial:   less than 140 mg/dL      Peak postprandial:   less than 180 mg/dL (1-2 hours)      Critically ill patients:  140 - 180 mg/dL  Results for RONEL, RODEHEAVER (MRN [id number]) as of 11/25/2013 12:27  Ref. Range 11/24/2013 04:57 11/24/2013 05:18  Glucose Latest Range: 70-99 mg/dL 409 (H) 811 (H)  Results for OSMEL, DYKSTRA (MRN [id number]) as of 11/25/2013 12:27  Ref. Range 11/25/2013 04:37  Glucose Latest Range: 70-99 mg/dL 213 (H)    Inpatient Diabetes Program Recommendations Correction (SSI): consider adding Novolog moderate scale TID + HS scale  HgbA1C: order to assess prehospital glucose control Will follow. Thank you  Piedad Climes BSN, RN,CDE Inpatient Diabetes Coordinator [phone] (team pager)

## 2013-11-26 LAB — GLUCOSE, CAPILLARY
Glucose-Capillary: 214 mg/dL — ABNORMAL HIGH (ref 70–99)
Glucose-Capillary: 167 mg/dL — ABNORMAL HIGH (ref 70–99)
Glucose-Capillary: 149 mg/dL — ABNORMAL HIGH (ref 70–99)

## 2013-11-26 LAB — HEMOGLOBIN A1C: Mean Plasma Glucose: 171 mg/dL — ABNORMAL HIGH (ref <117)

## 2013-11-26 MED ORDER — GLIPIZIDE ER 5 MG PO TB24
5.0000 mg | ORAL_TABLET | Freq: Every day | ORAL | Status: DC
  Administered 2013-11-27: 5 mg via ORAL
  Filled 2013-11-26 (×2): qty 1

## 2013-11-26 MED ORDER — METOPROLOL TARTRATE 25 MG PO TABS
37.5000 mg | ORAL_TABLET | Freq: Two times a day (BID) | ORAL | Status: DC
  Administered 2013-11-26 – 2013-11-27 (×2): 37.5 mg via ORAL
  Filled 2013-11-26 (×3): qty 1

## 2013-11-26 NOTE — Progress Notes (Signed)
Pt. Seen and examined. Agree with the NP/PA-C note as written. He is doing well, wants to go home. Unfortunately he is still having PVC's. His BG is not well-controlled either. We are awaiting an A1c. He reports he was on metformin and Januvia (but stopped Januvia due to cost and his Dr. York Spaniel he didn't need it).  I do feel like he needs better BG control, especially in the setting of STEMI.  Would recommend adding glipizide XL 5 mg daily. Increase lopressor to 37.5 mg BID.  Chrystie Nose, MD, [facility] Attending Cardiologist [facility]

## 2013-11-26 NOTE — Progress Notes (Signed)
CARDIAC REHAB PHASE I   PRE:  Rate/Rhythm: 94 SR with PVC's  BP:  Supine:   Sitting:100/60  Standing:    SaO2:   MODE:  Ambulation: 550 ft   POST:  Rate/Rhythm: 106 ST with PVC's  BP:  Supine:   Sitting: 126/52  Standing:    SaO2:  1040-1200 Pt tolerated ambulation with handheld  assist X 1. Pt a little unsteady. Pt 's wife states that he has a tremor that makes him shaky. He was able to walk 550 feet without c/o of cp or SOB. VS stable. Pt back to side of bed after walk with call light in reach. Completed MI and stent education with pt., wife and son. They voice understanding. Pt agrees to Outpt. CRP in [place], will send referral,  Melina Copa RN 11/26/2013 12:03 PM

## 2013-11-26 NOTE — Progress Notes (Signed)
Subjective: No complaints. Denies CP/SOB. He has been working with CR with little difficulty.   Objective: Vital signs in last 24 hours: Temp:  [97.7 F (36.5 C)-98.2 F (36.8 C)] 97.7 F (36.5 C) (12/09 0500) Pulse Rate:  [65-87] 87 (12/09 0500) Resp:  [20] 20 (12/09 0500) BP: (103-118)/(43-71) 112/71 mmHg (12/09 0500) SpO2:  [98 %-100 %] 98 % (12/09 0500) Last BM Date: 11/24/13  Intake/Output from previous day: 12/08 0701 - 12/09 0700 In: 840 [P.O.:840] Out: 1180 [Urine:1180] Intake/Output this shift:    Medications Current Facility-Administered Medications  Medication Dose Route Frequency Provider Last Rate Last Dose  . 0.9 %  sodium chloride infusion  250 mL Intravenous PRN Marykay Lex, MD      . acetaminophen (TYLENOL) tablet 650 mg  650 mg Oral Q4H PRN Marykay Lex, MD      . alum & mag hydroxide-simeth (MAALOX/MYLANTA) 200-200-20 MG/5ML suspension 30 mL  30 mL Oral PRN Marykay Lex, MD   30 mL at 11/24/13 1644  . aspirin chewable tablet 81 mg  81 mg Oral Daily Marykay Lex, MD   81 mg at 11/25/13 1020  . atorvastatin (LIPITOR) tablet 80 mg  80 mg Oral q1800 Marykay Lex, MD   80 mg at 11/25/13 2221  . ceFAZolin (ANCEF) IVPB 2 g/50 mL premix  2 g Intravenous Once Marykay Lex, MD      . metFORMIN (GLUCOPHAGE) tablet 1,000 mg  1,000 mg Oral BID WC Brittainy Simmons, PA-C   1,000 mg at 11/26/13 0845  . metoprolol tartrate (LOPRESSOR) tablet 25 mg  25 mg Oral BID Marykay Lex, MD   25 mg at 11/25/13 2200  . morphine 2 MG/ML injection 2 mg  2 mg Intravenous Q1H PRN Marykay Lex, MD   2 mg at 11/24/13 1646  . ondansetron (ZOFRAN) injection 4 mg  4 mg Intravenous Q6H PRN Marykay Lex, MD      . phenytoin (DILANTIN) ER capsule 200 mg  200 mg Oral Daily Brittainy Simmons, PA-C   200 mg at 11/25/13 1020  . phenytoin (DILANTIN) ER capsule 300 mg  300 mg Oral QHS Marykay Lex, MD   300 mg at 11/25/13 2220  . prasugrel (EFFIENT) tablet 10 mg  10 mg  Oral Daily Marykay Lex, MD   10 mg at 11/25/13 1032  . sodium chloride 0.9 % injection 3 mL  3 mL Intravenous Q12H Marykay Lex, MD   3 mL at 11/25/13 1000  . sodium chloride 0.9 % injection 3 mL  3 mL Intravenous PRN Marykay Lex, MD        PE: General appearance: alert, cooperative and no distress Lungs: bilateral rhonchi  Heart: regular rate and rhythm and w/ occasional PVCs Extremities: no LEE Pulses: 2+ and symmetric Skin: warm and dry Neurologic: Grossly normal  Lab Results:   Recent Labs  11/24/13 0457 11/24/13 0518 11/25/13 0437  WBC 8.7  --  7.0  HGB 13.3 13.6 12.3*  HCT 39.1 40.0 35.9*  PLT 193  --  180   BMET  Recent Labs  11/24/13 0457 11/24/13 0518 11/25/13 0437  NA 139 141 135  K 4.1 3.8 4.4  CL 101 104 102  CO2 27  --  26  GLUCOSE 206* 209* 228*  BUN 16 16 10   CREATININE 0.75 1.00 0.68  CALCIUM 9.3  --  8.5   PT/INR No results found for this basename: LABPROT,  INR,  in the last 72 hours Cholesterol  Recent Labs  11/24/13 0457  CHOL 205*    Assessment/Plan  Principal Problem:   ST elevation myocardial infarction (STEMI) of inferolateral wall, initial episode of care -s/p PCI + DES to left circumflex Active Problems:   DM (diabetes mellitus)   Dyslipidemia, goal LDL below 70   Presence of drug coated stent in left circumflex coronary artery: Xience Xpedition DSE 3.5 mm x 23 mm ( 3.75 mm)   CAD S/P percutaneous coronary angioplasty  Plan: Day 2 s/p inferolateral wall STEMI. DES to left circumflex. No further CP or SOB. Continue DAPT with ASA + Effient.  Also continue Lopressor 25 mg BID. He will need uptitration, as he continues to have frequent PVCs on telemetry. HR is controlled in the 70s. BP is stable. Continue cardiac rehab. Blood glucose has been elevated in the 200s. However, his Metformin was on hold for 48 hours post cath. This is to be resumed today. Pt was seen by diabetes coordinator who recommended checking Hgb A1C to  assess pre-hosptial glucose control. Hopefully his glucose levels will improve once Metformin is restarted. Hgb A1C has been ordered. If elevated, will refer back to PCP for better management. Possibly home later this PM or tomorrow. MD to follow.     LOS: 2 days    Brittainy M. Sharol Harness, PA-C 11/26/2013 9:25 AM

## 2013-11-27 ENCOUNTER — Inpatient Hospital Stay (HOSPITAL_COMMUNITY): Payer: Medicare Other

## 2013-11-27 DIAGNOSIS — I5189 Other ill-defined heart diseases: Secondary | ICD-10-CM | POA: Diagnosis present

## 2013-11-27 DIAGNOSIS — G40909 Epilepsy, unspecified, not intractable, without status epilepticus: Secondary | ICD-10-CM | POA: Diagnosis present

## 2013-11-27 LAB — GLUCOSE, CAPILLARY: Glucose-Capillary: 158 mg/dL — ABNORMAL HIGH (ref 70–99)

## 2013-11-27 LAB — BASIC METABOLIC PANEL WITH GFR
BUN: 13 mg/dL (ref 6–23)
CO2: 25 meq/L (ref 19–32)
Calcium: 8.5 mg/dL (ref 8.4–10.5)
Chloride: 105 meq/L (ref 96–112)
Creatinine, Ser: 0.66 mg/dL (ref 0.50–1.35)
GFR calc Af Amer: >90 mL/min
GFR calc non Af Amer: >90 mL/min
Glucose, Bld: 196 mg/dL — ABNORMAL HIGH (ref 70–99)
Potassium: 4.1 meq/L (ref 3.5–5.1)
Sodium: 138 meq/L (ref 135–145)

## 2013-11-27 MED ORDER — ATORVASTATIN CALCIUM 80 MG PO TABS: 80.0000 mg | ORAL_TABLET | Freq: Every day | ORAL | Status: DC

## 2013-11-27 MED ORDER — ACETAMINOPHEN 325 MG PO TABS: 650.0000 mg | ORAL_TABLET | ORAL | Status: AC | PRN

## 2013-11-27 MED ORDER — GLIPIZIDE ER 5 MG PO TB24: 5.0000 mg | ORAL_TABLET | Freq: Every day | ORAL | Status: DC

## 2013-11-27 MED ORDER — METOPROLOL TARTRATE 50 MG PO TABS: 50.0000 mg | ORAL_TABLET | Freq: Two times a day (BID) | ORAL | Status: DC

## 2013-11-27 MED ORDER — PRASUGREL HCL 10 MG PO TABS: 10.0000 mg | ORAL_TABLET | Freq: Every day | ORAL | Status: DC

## 2013-11-27 MED ORDER — ASPIRIN 81 MG PO CHEW: 81.0000 mg | CHEWABLE_TABLET | Freq: Every day | ORAL | Status: DC

## 2013-11-27 MED ORDER — NITROGLYCERIN 0.4 MG SL SUBL: 0.4000 mg | SUBLINGUAL_TABLET | SUBLINGUAL | Status: DC | PRN

## 2013-11-27 NOTE — Progress Notes (Signed)
Subjective:  No chest pain  Objective:  Vital Signs in the last 24 hours: Temp:  [97.4 F (36.3 C)-97.9 F (36.6 C)] 97.9 F (36.6 C) (12/10 0427) Pulse Rate:  [69-86] 79 (12/10 0427) Resp:  [18-20] 18 (12/10 0427) BP: (117-125)/(53-65) 118/65 mmHg (12/10 0427) SpO2:  [95 %-99 %] 99 % (12/10 0427)  Intake/Output from previous day:  Intake/Output Summary (Last 24 hours) at 11/27/13 0930 Last data filed at 11/26/13 1700  Gross per 24 hour  Intake    480 ml  Output    450 ml  Net     30 ml    Physical Exam: General appearance: alert, cooperative and no distress Lungs: clear to auscultation bilaterally Heart: regular rate and rhythm   Rate: 78  Rhythm: normal sinus rhythm, premature ventricular contractions (PVC) and couplets, 3 bt runs  Lab Results:  Recent Labs  11/25/13 0437  WBC 7.0  HGB 12.3*  PLT 180    Recent Labs  11/25/13 0437 11/27/13 0521  NA 135 138  K 4.4 4.1  CL 102 105  CO2 26 25  GLUCOSE 228* 196*  BUN 10 13  CREATININE 0.68 0.66    Recent Labs  11/24/13 1945 11/25/13 0836  TROPONINI >20.00* 17.86*   No results found for this basename: INR,  in the last 72 hours  Imaging: No CXR was done  Cardiac Studies:  2D Study Conclusions  - Left ventricle: The cavity size was normal. Wall thickness was normal. Systolic function was normal. The estimated ejection fraction was in the range of 55% to 60%. Probable mild hypokinesis of the mid-distalinferolateral myocardium. Doppler parameters are consistent with abnormal left ventricular relaxation (grade 1 diastolic dysfunction). Doppler parameters are consistent with elevated mean left atrial filling pressure. - Aortic valve: Mildly calcified annulus. Mildly thickened leaflets. - Mitral valve: Mildly calcified annulus. Mildly thickened leaflets . Mild regurgitation. - Left atrium: The atrium was mildly dilated. - Right ventricle: Systolic pressure was increased. - Atrial septum: No  defect or patent foramen ovale was identified. - Tricuspid valve: Mild regurgitation. - Pulmonary arteries: PA peak pressure: 32mm Hg (S). 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 Will review meds with MD. He is still having some ectopy. Consider adding ACE-I at follow up. Will ask OP DM RN to see for home teaching, (family worried about low BS with new medications). Home later today.  Corine Shelter PA-C Beeper [phone] 11/27/2013, 9:30 AM   He looks & feels good.  With PVCs still present, would prefer to simply increase Metoprolol to 50 mg bid & add ACE-I as OP if BP will tolerate.   Needs DM teaching & CXR to be done prior to d/c.  ROV with me or PA in ~2 weeks. He will also need to set up ROV appt with PCP to address DM coverage.  DAPT x 1 yr.  Has Effient card (converted to Effient to avoid interaction with Dilantin).  Continue Statin.  Marykay Lex, MD Marykay Lex, M.D., M.S. [facility] [address] [place], [place]  [address]  [phone] Pager # [phone] 11/27/2013 12:59 PM

## 2013-11-27 NOTE — Progress Notes (Signed)
Utilization review completed.  

## 2013-11-27 NOTE — Discharge Summary (Signed)
Patient ID: Melvin Morrow,  MRN: [id number], DOB/AGE: 27-Oct-1941 72 y.o.  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 He was awakened early in the morning 12/07 with chest pain. EMS was called. He had ST elevation in the inferior leads. He was taken to the cath lab by Dr Herbie Baltimore. Cath revealed mid CFX disease with a subtotal thrombotic occlusion which was intervened on with a DES. He had residual 80% small Dx and 30-40% RCA stenosis that will be treated medically. EF 60-65% at cath. Post MI he had PVCs and couplets and his beta blocker was increased. Echo confirmed his EF to be WNL. His BS was elevated and his diabetic medication was adjusted. Dr Herbie Baltimore feels he can be discharged 11/27/13.   Discharge Vitals:  Blood pressure 125/59, pulse 84, temperature 97.2 F (36.2 C), temperature source Oral, resp. rate 19, height 5\' 9"  (1.753 m), weight 186 lb 1.1 oz (84.4 kg), SpO2 99.00%.    Labs: Results for orders placed during the hospital encounter of 11/24/13 (from the past 48 hour(s))  GLUCOSE, CAPILLARY     Status: Abnormal   Collection Time    11/26/13  8:34 AM      Result Value Range   Glucose-Capillary 283 (*) 70 - 99 mg/dL   Comment 1 Notify RN     Comment 2 Documented in Chart    HEMOGLOBIN A1C     Status: Abnormal   Collection Time    11/26/13 10:50 AM      Result Value Range   Hemoglobin A1C 7.6 (*) <5.7 %   Comment: (NOTE)                                                                                According to the ADA Clinical Practice Recommendations for 2011, when     HbA1c is used as a screening test:      >=6.5%   Diagnostic of Diabetes Mellitus               (if abnormal result is confirmed)     5.7-6.4%   Increased risk of developing Diabetes Mellitus     References:Diagnosis and Classification of Diabetes Mellitus,Diabetes     Care,2011,34(Suppl 1):S62-S69 and Standards of Medical Care in             Diabetes - 2011,Diabetes Care,2011,34 (Suppl 1):S11-S61.   Mean Plasma Glucose 171 (*) <117 mg/dL   Comment: Performed at [facility]  GLUCOSE, CAPILLARY     Status: Abnormal   Collection Time    11/26/13 11:32 AM      Result Value Range   Glucose-Capillary 214 (*) 70 - 99 mg/dL   Comment  1 Notify RN     Comment 2 Documented in Chart    GLUCOSE, CAPILLARY     Status: Abnormal   Collection Time    11/26/13  4:36 PM      Result Value Range   Glucose-Capillary 167 (*) 70 - 99 mg/dL   Comment 1 Notify RN     Comment 2 Documented in Chart    GLUCOSE, CAPILLARY     Status: Abnormal   Collection Time    11/26/13  8:43 PM      Result Value Range   Glucose-Capillary 149 (*) 70 - 99 mg/dL  BASIC METABOLIC PANEL     Status: Abnormal   Collection Time    11/27/13  5:21 AM      Result Value Range   Sodium 138  135 - 145 mEq/L   Potassium 4.1  3.5 - 5.1 mEq/L   Chloride 105  96 - 112 mEq/L   CO2 25  19 - 32 mEq/L   Glucose, Bld 196 (*) 70 - 99 mg/dL   BUN 13  6 - 23 mg/dL   Creatinine, Ser 1.32  0.50 - 1.35 mg/dL   Calcium 8.5  8.4 - 44.0 mg/dL   GFR calc non Af Amer >90  >90 mL/min   GFR calc Af Amer >90  >90 mL/min   Comment: (NOTE)     The eGFR has been calculated using the CKD EPI equation.     This calculation has not been validated in all clinical situations.     eGFR's persistently <90 mL/min signify possible Chronic Kidney     Disease.  GLUCOSE, CAPILLARY     Status: Abnormal   Collection Time    11/27/13  7:33 AM      Result  Value Range   Glucose-Capillary 214 (*) 70 - 99 mg/dL   Comment 1 Documented in Chart     Comment 2 Notify RN    GLUCOSE, CAPILLARY     Status: Abnormal   Collection Time    11/27/13 12:07 PM      Result Value Range   Glucose-Capillary 158 (*) 70 - 99 mg/dL   Comment 1 Documented in Chart     Comment 2 Notify RN      Disposition:  Follow-up Information   Follow up with Abelino Derrick, PA-C On 12/11/2013. (1:40 pm)    Specialty:  Cardiology   Contact information:   [address] [place]  [place] [address] [phone]       Discharge Medications:    Medication List         acetaminophen 325 MG tablet  Commonly known as:  TYLENOL  Take 2 tablets (650 mg total) by mouth every 4 (four) hours as needed for headache or mild pain.     aspirin 81 MG chewable tablet  Chew 1 tablet (81 mg total) by mouth daily.     atorvastatin 80 MG tablet  Commonly known as:  LIPITOR  Take 1 tablet (80 mg total) by mouth daily at 6 PM.     glipiZIDE 5 MG 24 hr tablet  Commonly known as:  GLUCOTROL XL  Take 1 tablet (5 mg total) by mouth daily with breakfast.     metFORMIN 500 MG tablet  Commonly known as:  GLUCOPHAGE  Take 1,000 mg by mouth 2 (two) times daily with a meal.     metoprolol 50 MG tablet  Commonly known as:  LOPRESSOR  Take 1 tablet (50 mg total)  by mouth 2 (two) times daily.     nitroGLYCERIN 0.4 MG SL tablet  Commonly known as:  NITROSTAT  Place 1 tablet (0.4 mg total) under the tongue every 5 (five) minutes as needed for chest pain.     phenytoin 100 MG ER capsule  Commonly known as:  DILANTIN  Take 200-300 mg by mouth 2 (two) times daily. Take 2 caps ( 200mg ) in the morning and 3 caps ( 300mg ) at bedtime     prasugrel 10 MG Tabs tablet  Commonly known as:  EFFIENT  Take 1 tablet (10 mg total) by mouth daily.         Duration of Discharge Encounter: Greater than 30 minutes including physician time.  Jolene Provost PA-C 11/27/2013 1:52 PM

## 2013-11-27 NOTE — Progress Notes (Signed)
CARDIAC REHAB PHASE I   PRE:  Rate/Rhythm: 77 SR with PVC's  BP:  Supine:   Sitting: 105/50  Standing:    SaO2: 98 RA  MODE:  Ambulation: 900 ft   POST:  Rate/Rhythm: 77   BP:  Supine:   Sitting: 94/50  Standing:    SaO2: 99 RA 1125-1155  Assisted X 1 to ambulate. Gait more steady today. VS stable Pt back to side of bed after walk with call light in reach and family present.  Melina Copa RN 11/27/2013 11:53 AM

## 2013-11-27 NOTE — Discharge Summary (Signed)
Did well post Inferolateral STEMI.  CBG control was difficult due to Metformin being held.  Medications adjusted & DM counseling given.  PVCs persisted post PCI of LCx - BB dose titrated up to 50 mg bid.    On DAPT.    OK for d/c home today.  ROV as scheduled.  I agree with the D/C Summary.  Marykay Lex, M.D., M.S. [facility] [address] [place], [place]  [address]  [phone] Pager # [phone] 11/27/2013 3:51 PM

## 2013-12-11 ENCOUNTER — Encounter: Payer: Self-pay | Admitting: Cardiology

## 2013-12-11 ENCOUNTER — Ambulatory Visit: Payer: Medicare Other | Admitting: Cardiology

## 2013-12-11 ENCOUNTER — Ambulatory Visit (INDEPENDENT_AMBULATORY_CARE_PROVIDER_SITE_OTHER): Payer: Medicare Other | Admitting: Cardiology

## 2013-12-11 VITALS — BP 120/60 | HR 60 | Ht 71.0 in | Wt 185.0 lb

## 2013-12-11 DIAGNOSIS — E1165 Type 2 diabetes mellitus with hyperglycemia: Secondary | ICD-10-CM

## 2013-12-11 DIAGNOSIS — I519 Heart disease, unspecified: Secondary | ICD-10-CM

## 2013-12-11 DIAGNOSIS — I2510 Atherosclerotic heart disease of native coronary artery without angina pectoris: Secondary | ICD-10-CM

## 2013-12-11 DIAGNOSIS — IMO0001 Reserved for inherently not codable concepts without codable children: Secondary | ICD-10-CM

## 2013-12-11 DIAGNOSIS — E785 Hyperlipidemia, unspecified: Secondary | ICD-10-CM

## 2013-12-11 DIAGNOSIS — I5189 Other ill-defined heart diseases: Secondary | ICD-10-CM

## 2013-12-11 DIAGNOSIS — I2119 ST elevation (STEMI) myocardial infarction involving other coronary artery of inferior wall: Secondary | ICD-10-CM

## 2013-12-11 NOTE — Assessment & Plan Note (Signed)
Followed by PMD 

## 2013-12-11 NOTE — Patient Instructions (Signed)
Your physician recommends that you schedule a follow-up appointment in: 3 months.  

## 2013-12-11 NOTE — Assessment & Plan Note (Signed)
Doing well, no angina 

## 2013-12-11 NOTE — Assessment & Plan Note (Signed)
Medi-cal[facility]

## 2013-12-11 NOTE — Progress Notes (Signed)
12/11/2013 Conception Oms   1941/05/11  [id number]  Primary Physicia Lesia Hausen, MD, [facility] Primary Cardiologist: Dr Herbie Baltimore  HPI:  72 y/o male from [place] with a history of diabetes and dyslipidemia. He was awakened early in the morning 12/07 with chest pain. EMS was called. He had ST elevation in the inferior leads. He was taken to the cath lab by Dr Herbie Baltimore. Cath revealed mid CFX disease with a subtotal thrombotic occlusion which was intervened on with a DES. He had residual 80% small Dx and 30-40% RCA stenosis that will be treated medically. EF 60-65% at cath. Post MI he had PVCs and couplets and his beta blocker was increased. Echo confirmed his EF to be WNL.         He is here today for post hospital visit. He is doing well. He has had no chest pain. His primary care MD is follow ing up on his lipids.     Current Outpatient Prescriptions  Medication Sig Dispense Refill  . acetaminophen (TYLENOL) 325 MG tablet Take 2 tablets (650 mg total) by mouth every 4 (four) hours as needed for headache or mild pain.      Marland Kitchen aspirin 81 MG chewable tablet Chew 1 tablet (81 mg total) by mouth daily.      Marland Kitchen atorvastatin (LIPITOR) 80 MG tablet Take 1 tablet (80 mg total) by mouth daily at 6 PM.  30 tablet  11  . glyBURIDE-metformin (GLUCOVANCE) 5-500 MG per tablet Take 2 tablets by mouth 2 (two) times daily with a meal.       . metoprolol (LOPRESSOR) 50 MG tablet Take 1 tablet (50 mg total) by mouth 2 (two) times daily.  60 tablet  11  . nitroGLYCERIN (NITROSTAT) 0.4 MG SL tablet Place 1 tablet (0.4 mg total) under the tongue every 5 (five) minutes as needed for chest pain.  25 tablet  12  . omeprazole (PRILOSEC) 20 MG capsule Take 20 mg by mouth daily.      . phenytoin (DILANTIN) 100 MG ER capsule Take 200-300 mg by mouth 2 (two) times daily. Take 2 caps ( 200mg ) in the morning and 3 caps ( 300mg ) at bedtime      . prasugrel (EFFIENT) 10 MG TABS tablet Take 1 tablet (10 mg  total) by mouth daily.  30 tablet  11   No current facility-administered medications for this visit.    Allergies  Allergen Reactions  . Sulfur     History   Social History  . Marital Status: Married    Spouse Name: N/A    Number of Children: N/A  . Years of Education: N/A   Occupational History  . Not on file.   Social History Main Topics  . Smoking status: Former Smoker    Types: Cigarettes  . Smokeless tobacco: Never Used     Comment: Quit 25 years ago.   . Alcohol Use: No  . Drug Use: No  . Sexual Activity: No   Other Topics Concern  . Not on file   Social History Narrative  . No narrative on file     Review of Systems: General: negative for chills, fever, night sweats or weight changes.  Cardiovascular: negative for chest pain, dyspnea on exertion, edema, orthopnea, palpitations, paroxysmal nocturnal dyspnea or shortness of breath Dermatological: negative for rash Respiratory: negative for cough or wheezing Urologic: negative for hematuria Abdominal: negative for nausea, vomiting, diarrhea, bright red blood per rectum, melena, or hematemesis Neurologic: negative  for visual changes, syncope, or dizziness All other systems reviewed and are otherwise negative except as noted above.    Blood pressure 120/60, pulse 60, height 5\' 11"  (1.803 m), weight 185 lb (83.915 kg).  General appearance: alert, cooperative and no distress Lungs: clear to auscultation bilaterally Heart: regular rate and rhythm    ASSESSMENT AND PLAN:   STEMI 11/24/13-s/p PCI + DES to CFX  Doing well, no angina  Dyslipidemia, goal LDL below 70 Followed by PMD  Diabetes mellitus type II, uncontrolled .  Diastolic dysfunction, grade 1 with EF 50-55% 11/24/13 .  CAD- residual 40% RCA, 80% Dx Medical Rx   PLAN: Same Rx. Dr Herbie Baltimore to see in 3 months.  Siniyah Evangelist KPA-C 12/11/2013 1:15 PM

## 2013-12-17 ENCOUNTER — Telehealth: Payer: Self-pay | Admitting: *Deleted

## 2013-12-17 NOTE — Telephone Encounter (Signed)
LATE ENTRY--  FAXED ORDER FOR CARDIAC  [facility] 12/11/13

## 2013-12-25 ENCOUNTER — Other Ambulatory Visit: Payer: Self-pay | Admitting: *Deleted

## 2013-12-25 MED ORDER — GLIPIZIDE 5 MG PO TABS: 5.0000 mg | ORAL_TABLET | Freq: Every day | ORAL | Status: DC

## 2013-12-25 MED ORDER — PRASUGREL HCL 10 MG PO TABS: 10.0000 mg | ORAL_TABLET | Freq: Every day | ORAL | Status: DC

## 2013-12-25 MED ORDER — METOPROLOL TARTRATE 50 MG PO TABS: 50.0000 mg | ORAL_TABLET | Freq: Two times a day (BID) | ORAL | Status: DC

## 2014-01-03 ENCOUNTER — Other Ambulatory Visit: Payer: Self-pay | Admitting: *Deleted

## 2014-01-03 MED ORDER — ATORVASTATIN CALCIUM 80 MG PO TABS: 80.0000 mg | ORAL_TABLET | Freq: Every day | ORAL | Status: DC

## 2014-01-06 ENCOUNTER — Telehealth: Payer: Self-pay | Admitting: *Deleted

## 2014-01-06 ENCOUNTER — Other Ambulatory Visit: Payer: Self-pay | Admitting: *Deleted

## 2014-01-06 MED ORDER — ATORVASTATIN CALCIUM 80 MG PO TABS: 80.0000 mg | ORAL_TABLET | Freq: Every day | ORAL | Status: DC

## 2014-01-06 NOTE — Telephone Encounter (Signed)
[facility] doesn't accept e rx.  I called in the rx for atorvastatin 80mg  daily #30 11 RF

## 2014-03-31 ENCOUNTER — Encounter: Payer: Self-pay | Admitting: Cardiology

## 2014-03-31 ENCOUNTER — Ambulatory Visit (INDEPENDENT_AMBULATORY_CARE_PROVIDER_SITE_OTHER): Payer: Medicare Other | Admitting: Cardiology

## 2014-03-31 VITALS — BP 140/60 | HR 72 | Ht 71.0 in | Wt 184.7 lb

## 2014-03-31 DIAGNOSIS — I10 Essential (primary) hypertension: Secondary | ICD-10-CM

## 2014-03-31 DIAGNOSIS — Z9861 Coronary angioplasty status: Secondary | ICD-10-CM

## 2014-03-31 DIAGNOSIS — E785 Hyperlipidemia, unspecified: Secondary | ICD-10-CM

## 2014-03-31 DIAGNOSIS — I2510 Atherosclerotic heart disease of native coronary artery without angina pectoris: Secondary | ICD-10-CM

## 2014-03-31 DIAGNOSIS — I2119 ST elevation (STEMI) myocardial infarction involving other coronary artery of inferior wall: Secondary | ICD-10-CM

## 2014-03-31 DIAGNOSIS — I519 Heart disease, unspecified: Secondary | ICD-10-CM

## 2014-03-31 DIAGNOSIS — I5189 Other ill-defined heart diseases: Secondary | ICD-10-CM

## 2014-03-31 NOTE — Patient Instructions (Signed)
Your physician wants you to follow-up in Nov 2015 DR HARDING.  You will receive a reminder letter in the mail two months in advance. If you don't receive a letter, please call our office to schedule the follow-up appointment.  PLEASE  HAVE PRIMARY TO SEND A COPY A LABS -ESPECIALLY LIPIDS

## 2014-04-02 ENCOUNTER — Encounter: Payer: Self-pay | Admitting: Cardiology

## 2014-04-02 DIAGNOSIS — I10 Essential (primary) hypertension: Secondary | ICD-10-CM | POA: Insufficient documentation

## 2014-04-02 DIAGNOSIS — Z9861 Coronary angioplasty status: Principal | ICD-10-CM

## 2014-04-02 DIAGNOSIS — I2510 Atherosclerotic heart disease of native coronary artery without angina pectoris: Secondary | ICD-10-CM | POA: Insufficient documentation

## 2014-04-02 NOTE — Assessment & Plan Note (Signed)
No additional anal symptoms. He is on aspirin plus Effient with no bleeding issues. He is taking a statin dose. Unfortunately I do not have the results of his lipid panel. His PCP is following this. He is on a beta blocker but not ACE inhibitor/ARB. At present his EF is normal, therefore from a cardiac standpoint no absolute indication, however with his diabetes the next antihypertensive agent should be either an ACE inhibitor or ARB.

## 2014-04-02 NOTE — Assessment & Plan Note (Signed)
No active heart failure symptoms. Treatment for this would be blood pressure control with afterload reducing agents such as the her ACE inhibitor. If he were to have recurrence of her failure symptoms first line treatment would be addition of afterload reducing agents.

## 2014-04-02 NOTE — Assessment & Plan Note (Signed)
On high-dose Lipitor, followed by PCP. Need results in order to ensure that he is meeting goal.

## 2014-04-02 NOTE — Assessment & Plan Note (Signed)
No residual symptoms. No heart failure. Preserved EF on echocardiogram. Unfortunately, he did not do cardiac rehabilitation. I did encourage increased activity level and returning to baseline activity.

## 2014-04-02 NOTE — Assessment & Plan Note (Signed)
Blood pressure had been relatively stable during his last visit, but his old bit on the high side of normal today. Is a diabetic he is above goal currently. As noted above. Next agent would be uterine ACE inhibitor or ARB. I would defer to PCP to initiate treatment if indicated by blood pressure at followup visits.

## 2014-04-02 NOTE — Progress Notes (Signed)
Melvin Morrow: Melvin Morrow MRN: [id number]  DOB: 08/11/1941   DOV:04/02/2014 PCP: Lesia HausenKUMMER,ANTHONY J, MD  Clinic Note: Chief Complaint  Patient presents with  . 4 month ROV    c/o lightheadedness    HPI: Melvin OmsWilliam Morrow is a 73 y.o.  male with a PMH below who presents today for for followup after his inferolateral STEMI in December. He had a subtotal occluded circumflex treated with a Xience DES stent. He [place] Corine Shel[place] 24 for initial post hospital followup at which time he was doing relatively well.  Interval History: He seems to be doing relatively well since his last visit with Corine ShelterLuke Kilroy. He has been trying to do as much walking and exercise as he can, but has not gotten into any routine exercise. He did not do cardiac rehabilitation. Would level activities doing, he denies any chest tightness or pressure with rest or exertion consistent with angina. No exertional or resting dyspnea. He does note that he has heartburn symptoms. He was undergoing evaluation for his anal fissure/perirectal abscess. The surgeon felt like this was stable and did not need any redo operation. No bleeding from the abscess.  The remainder of Cardiovascular ROS:  No PND, orthopnea or edema.  No palpitations, lightheadedness, dizziness, weakness or syncope/near syncope. No TIA/amaurosis fugax symptoms. No melena, hematochezia, hematuria, or epistaxis. No Claudication    Past Medical History  Diagnosis Date  . ST elevation myocardial infarction (STEMI) of inferior wall, subsequent episode of care 11/24/2014    Occluded Cric -- PCI  . CAD S/P percutaneous coronary angioplasty 11/24/2014    Cx PCI: Xience Alpine 3.5 mm x 23 mm (3.75 mm); distal OM branch 95%; Small OM-ostial 80%, small Diag ~80%  . Diabetes mellitus type 2 in nonobese     Poorly controlled  . Hyperlipidemia   . AAA (abdominal aortic aneurysm)   . Ischiorectal abscess   . Nephrolithiasis   . Arthritis   . Seizure disorder       Distant past  . Essential hypertension     Prior Cardiac Evaluation and Past Surgical History: Past Surgical History  Procedure Laterality Date  . Anal fissure repair    . Percutaneous coronary stent intervention (pci-s)  11/24/2013    Subtotalled Cx - PCI with Xience Alpine DES 3.5 mm x 23 mm ( 3.75 mm)  . Cardiac catheterization  11/24/2013    Subtotal occluded mid circumflex; distal branch of lateral OM 95%; small OM1 80% ostial, small diagonal 180% ostial.  . Transthoracic echocardiogram  11/24/2013    EF 55-60%. Mild hypokinesis of the mid--distal inferior lateral wall. Grade 1 diastolic dysfunction. Elevated LAP; no other significant findings. Mild aortic valve calcification .    Allergies  Allergen Reactions  . Sulfur    Current Outpatient Prescriptions  Medication Sig Dispense Refill  . acetaminophen (TYLENOL) 325 MG tablet Take 2 tablets (650 mg total) by mouth every 4 (four) hours as needed for headache or mild pain.      Marland Kitchen. aspirin 81 MG chewable tablet Chew 1 tablet (81 mg total) by mouth daily.      Marland Kitchen. atorvastatin (LIPITOR) 80 MG tablet Take 1 tablet (80 mg total) by mouth daily at 6 PM.  30 tablet  11  . fluticasone (FLONASE) 50 MCG/ACT nasal spray as needed.      Marland Kitchen. glipiZIDE (GLUCOTROL) 5 MG tablet Take 1 tablet (5 mg total) by mouth daily before breakfast.  30 tablet  11  . glyBURIDE-metformin (GLUCOVANCE)  5-500 MG per tablet Take 2 tablets by mouth 2 (two) times daily with a meal.       . metoprolol (LOPRESSOR) 50 MG tablet Take 1 tablet (50 mg total) by mouth 2 (two) times daily.  60 tablet  11  . nitroGLYCERIN (NITROSTAT) 0.4 MG SL tablet Place 1 tablet (0.4 mg total) under the tongue every 5 (five) minutes as needed for chest pain.  25 tablet  12  . omeprazole (PRILOSEC) 20 MG capsule Take 20 mg by mouth daily.      . phenytoin (DILANTIN) 100 MG ER capsule Take 200-300 mg by mouth 2 (two) times daily. Take 2 caps ( 200mg ) in the morning and 3 caps ( 300mg ) at  bedtime      . prasugrel (EFFIENT) 10 MG TABS tablet Take 1 tablet (10 mg total) by mouth daily.  30 tablet  11   No current facility-administered medications for this visit.    History   Social History Narrative   Married. [place]   Former smoker, quit 25 years ago;   He opted out of cardiac rehabilitation, states that he walks "when he can. No routine exercise.    ROS: A comprehensive Review of Systems - Negative except Symptoms in history of present illness as well as some mild arthralgias. No myalgia  PHYSICAL EXAM BP 140/60  Pulse 72  Ht 5\' 11"  (1.803 m)  Wt 184 lb 11.2 oz (83.779 kg)  BMI 25.77 kg/m2 General appearance: alert, cooperative, appears stated age, no distress and pleasant mood and affect, healthy-appearing Neck: no adenopathy, no carotid bruit, no JVD, supple, symmetrical, trachea midline and thyroid not enlarged, symmetric, no tenderness/mass/nodules Lungs: clear to auscultation bilaterally, normal percussion bilaterally and Non-labored, good air movement Heart: regular rate and rhythm, S1, S2 normal, no murmur, click, rub or gallop and normal apical impulse Abdomen: soft, non-tender; bowel sounds normal; no masses,  no organomegaly Extremities: extremities normal, atraumatic, no cyanosis or edema Pulses: 2+ and symmetric Neurologic: Grossly normal   Adult ECG Report  Rate: 72 ;  Rhythm: normal sinus rhythm, premature atrial contractions (PAC) and premature ventricular contractions (PVC)  QRS Axis: 36 ;  PR Interval: 160 ;  QRS Duration: 78 ; QTc: 429;  Voltages: Normal  Conduction Disturbances: none  Other Abnormalities: Very slight inferior Q waves suggesting inferior MI, age undetermined.   Narrative Interpretation: Stable EKG  Recent Labs: None since his discharge; apparently was checked by PCP  ASSESSMENT / PLAN: STEMI 11/24/13-s/p PCI + DES to CFX  No residual symptoms. No heart failure. Preserved EF on  echocardiogram. Unfortunately, he did not do cardiac rehabilitation. I did encourage increased activity level and returning to baseline activity.  CAD S/P percutaneous coronary angioplasty; residual 40% RCA, 80% Dx; small OM 80% No additional anal symptoms. He is on aspirin plus Effient with no bleeding issues. He is taking a statin dose. Unfortunately I do not have the results of his lipid panel. His PCP is following this. He is on a beta blocker but not ACE inhibitor/ARB. At present his EF is normal, therefore from a cardiac standpoint no absolute indication, however with his diabetes the next antihypertensive agent should be either an ACE inhibitor or ARB.  Essential hypertension Blood pressure had been relatively stable during his last visit, but his old bit on the high side of normal today. Is a diabetic he is above goal currently. As noted above. Next agent would be uterine ACE inhibitor or ARB. I  would defer to PCP to initiate treatment if indicated by blood pressure at followup visits.  Dyslipidemia, goal LDL below 70 On high-dose Lipitor, followed by PCP. Need results in order to ensure that he is meeting goal.  Diastolic dysfunction, grade 1 with EF 55-60 % 11/24/13 No active heart failure symptoms. Treatment for this would be blood pressure control with afterload reducing agents such as the her ACE inhibitor. If he were to have recurrence of her failure symptoms first line treatment would be addition of afterload reducing agents.    Orders Placed This Encounter  Procedures  . EKG 12-Lead   Meds ordered this encounter  Medications  . fluticasone (FLONASE) 50 MCG/ACT nasal spray    Sig: as needed.    Followup: November 2015  DAVID W. Herbie Baltimore, M.D., M.S. Interventional Cardiology [facility]

## 2014-07-24 ENCOUNTER — Telehealth: Payer: Self-pay | Admitting: Cardiology

## 2014-07-24 DIAGNOSIS — Z9861 Coronary angioplasty status: Secondary | ICD-10-CM

## 2014-07-24 DIAGNOSIS — I2119 ST elevation (STEMI) myocardial infarction involving other coronary artery of inferior wall: Secondary | ICD-10-CM

## 2014-07-24 DIAGNOSIS — I2510 Atherosclerotic heart disease of native coronary artery without angina pectoris: Secondary | ICD-10-CM

## 2014-07-24 DIAGNOSIS — I10 Essential (primary) hypertension: Secondary | ICD-10-CM

## 2014-07-24 NOTE — Telephone Encounter (Signed)
Pt would like to know if he can change from Effient to something else.The Eddient is too expensive.

## 2014-07-24 NOTE — Telephone Encounter (Signed)
We can try to switch to Plavix. The plan would be to take Plavix for 2 weeks and then check a P2Y12 inhibition assay. If he is appropriate responder, then we can continue the Plavix.  Marykay LexHARDING,DAVID W, MD

## 2014-07-24 NOTE — Telephone Encounter (Signed)
Returned call to patient's wife she stated Effient too expensive.Stated she wanted to ask Dr.Harding when can husband start Plavix instead of Effient.Message sent to Dr.Harding.

## 2014-07-25 MED ORDER — CLOPIDOGREL BISULFATE 75 MG PO TABS
75.0000 mg | ORAL_TABLET | Freq: Every day | ORAL | Status: DC
Start: 2014-07-25 — End: 2014-08-11

## 2014-07-25 NOTE — Telephone Encounter (Signed)
Returned call to patient's wife Dr.Harding advised may switch to Plavix .Advised will need Lab P2Y12 in 2 weeks.Lab order mailed to patient.Plavix 75 mg daily sent to pharmacy.Patient has 1 more day of Effient will start Plavix 07/27/14.

## 2014-08-11 ENCOUNTER — Telehealth: Payer: Self-pay | Admitting: *Deleted

## 2014-08-11 LAB — PLATELET INHIBITION P2Y12: Platelet Function  P2Y12: 77 [PRU] — ABNORMAL LOW (ref 194–418)

## 2014-08-11 MED ORDER — CLOPIDOGREL BISULFATE 75 MG PO TABS: 75.0000 mg | ORAL_TABLET | Freq: Every day | ORAL | Status: DC

## 2014-08-11 NOTE — Telephone Encounter (Signed)
Left message to call back concerning him to continue taking Plavix

## 2014-08-11 NOTE — Telephone Encounter (Signed)
Message copied by Tobin Chad on Mon Aug 11, 2014  1:02 PM ------      Message from: Marykay Lex      Created: Mon Aug 11, 2014 12:07 PM       Good Plavix response - OK to continue with Plavix.            Marykay Lex, MD       ------

## 2014-08-11 NOTE — Telephone Encounter (Signed)
Spoke to wife. Result given . Verbalized understanding Request increase quantity to pharmacy RN E-SENT A NEW RX

## 2014-11-10 ENCOUNTER — Encounter: Payer: Self-pay | Admitting: Cardiology

## 2014-11-10 ENCOUNTER — Ambulatory Visit (INDEPENDENT_AMBULATORY_CARE_PROVIDER_SITE_OTHER): Payer: Medicare Other | Admitting: Cardiology

## 2014-11-10 VITALS — BP 118/70 | HR 74 | Ht 70.0 in | Wt 186.3 lb

## 2014-11-10 DIAGNOSIS — Z9861 Coronary angioplasty status: Secondary | ICD-10-CM

## 2014-11-10 DIAGNOSIS — I10 Essential (primary) hypertension: Secondary | ICD-10-CM

## 2014-11-10 DIAGNOSIS — I519 Heart disease, unspecified: Secondary | ICD-10-CM

## 2014-11-10 DIAGNOSIS — IMO0002 Reserved for concepts with insufficient information to code with codable children: Secondary | ICD-10-CM

## 2014-11-10 DIAGNOSIS — I2510 Atherosclerotic heart disease of native coronary artery without angina pectoris: Secondary | ICD-10-CM

## 2014-11-10 DIAGNOSIS — I5189 Other ill-defined heart diseases: Secondary | ICD-10-CM

## 2014-11-10 DIAGNOSIS — R413 Other amnesia: Secondary | ICD-10-CM

## 2014-11-10 DIAGNOSIS — E1165 Type 2 diabetes mellitus with hyperglycemia: Secondary | ICD-10-CM

## 2014-11-10 DIAGNOSIS — E785 Hyperlipidemia, unspecified: Secondary | ICD-10-CM

## 2014-11-10 NOTE — Progress Notes (Signed)
PCP: Lesia Hausen, MD  Clinic Note: Chief Complaint  Patient presents with  . Follow-up    no cardiac complaints    HPI: Melvin Morrow is a 73 y.o. male with a PMH below who presents today for six-month followup of coronary disease status post inferior STEMI.   He was switched from Effient to Plavix in the interim since his last visit. He has an upcoming visit with Dr. Toni Arthurs to discuss his ischiorectal abscess -- he may in fact need some type of surgical repair to prevent it from happening again.   Past Medical History  Diagnosis Date  . ST elevation myocardial infarction (STEMI) of inferior wall, subsequent episode of care 11/24/2013    Occluded L Cx -- PCI with DES  . CAD S/P percutaneous coronary angioplasty 12/7/20154    Cx PCI: Xience Alpine 3.5 mm x 23 mm (3.75 mm); distal OM branch 95%; Small OM-ostial 80%, small Diag ~80%  . Diabetes mellitus type 2 in nonobese     Poorly controlled  . Hyperlipidemia   . AAA (abdominal aortic aneurysm)   . Ischiorectal abscess   . Nephrolithiasis   . Arthritis   . Seizure disorder     Distant past  . Essential hypertension     Prior Cardiac Evaluation & Procedural History: Procedure Laterality Date  . Percutaneous coronary stent intervention (pci-s)  11/24/2013    Subtotalled Cx - PCI with Xience Alpine DES 3.5 mm x 23 mm ( 3.75 mm)  . Cardiac catheterization  11/24/2013    Subtotal occluded mid circumflex; distal branch of lateral OM 95%; small OM1 80% ostial, small diagonal 180% ostial.  . Transthoracic echocardiogram  11/24/2013    EF 55-60%. Mild hypokinesis of the mid--distal inferior lateral wall. Grade 1 diastolic dysfunction. Elevated LAP; no other significant findings. Mild aortic valve calcification .   Interval History: He presents today really without any major complaints.  I think he misunderstood the prescription for his metoprolol was only taking it once daily. He has denied any sensation of chest tightness or  pressure or, dyspnea with rest or exertion.  No CHF findings ofPND, orthopnea or edema.  He has had some intermittent palpitations, but denies any lightheadedness, dizziness, weakness, syncope/near syncope, orTIA/amaurosis fugax symptoms.  He was recently started on Aricept for concerns of possible dementia symptoms. His PCP is following his lipids and was told "they were good last visit. He also noted to have his A1c by recent check was roughly 6 down from 9.  ROS: A comprehensive was performed. Review of Systems  Constitutional: Negative for weight loss and malaise/fatigue.  HENT: Negative for congestion and nosebleeds.   Respiratory: Negative for cough, sputum production and wheezing.   Cardiovascular: Negative for claudication.  Gastrointestinal: Negative for blood in stool and melena.  Genitourinary: Negative for hematuria.  Musculoskeletal: Positive for joint pain. Negative for falls.  Neurological: Negative for dizziness, sensory change, speech change, focal weakness, seizures and loss of consciousness.  Endo/Heme/Allergies: Bruises/bleeds easily.  Psychiatric/Behavioral: Positive for memory loss. Negative for depression. The patient is not nervous/anxious and does not have insomnia.   All other systems reviewed and are negative.   Current Outpatient Prescriptions on File Prior to Visit  Medication Sig Dispense Refill  . acetaminophen (TYLENOL) 325 MG tablet Take 2 tablets (650 mg total) by mouth every 4 (four) hours as needed for headache or mild pain.    Marland Kitchen aspirin 81 MG chewable tablet Chew 1 tablet (81 mg total) by mouth daily.    Marland Kitchen  atorvastatin (LIPITOR) 80 MG tablet Take 1 tablet (80 mg total) by mouth daily at 6 PM. 30 tablet 11  . clopidogrel (PLAVIX) 75 MG tablet Take 1 tablet (75 mg total) by mouth daily. 90 tablet 3  . fluticasone (FLONASE) 50 MCG/ACT nasal spray as needed.    . glyBURIDE-metformin (GLUCOVANCE) 5-500 MG per tablet Take 2 tablets by mouth 2 (two) times  daily with a meal.     . metoprolol (LOPRESSOR) 50 MG tablet Take 1 tablet (50 mg total) by mouth 2 (two) times daily. 60 tablet 11  . nitroGLYCERIN (NITROSTAT) 0.4 MG SL tablet Place 1 tablet (0.4 mg total) under the tongue every 5 (five) minutes as needed for chest pain. 25 tablet 12  . omeprazole (PRILOSEC) 20 MG capsule Take 20 mg by mouth daily.    . phenytoin (DILANTIN) 100 MG ER capsule Take 200-300 mg by mouth 2 (two) times daily. Take 2 caps ( 200mg ) in the morning and 3 caps ( 300mg ) at bedtime     No current facility-administered medications on file prior to visit.   ALLERGIES REVIEWED IN EPIC -- no change SOCIAL AND FAMILY HISTORY REVIEWED IN EPIC -- no change  Wt Readings from Last 3 Encounters:  11/10/14 186 lb 4.8 oz (84.505 kg)  03/31/14 184 lb 11.2 oz (83.779 kg)  12/11/13 185 lb (83.915 kg)    PHYSICAL EXAM BP 118/70 mmHg  Pulse 74  Ht 5\' 10"  (1.778 m)  Wt 186 lb 4.8 oz (84.505 kg)  BMI 26.73 kg/m2 General appearance: alert, cooperative, appears stated age, no distress and pleasant mood and affect, healthy-appearing  Neck: no adenopathy, no carotid bruit, no JVD, supple, symmetrical, trachea midline and thyroid not enlarged, symmetric, no tenderness/mass/nodules  Lungs: CTAB, normal percussion bilaterally and Non-labored, good air movement  Heart: RRR, S1, S2 normal, no murmur, click, rub or gallop and normal apical impulse  Abdomen: soft, non-tender; bowel sounds normal; no masses, no organomegaly  Extremities: extremities normal, atraumatic, no cyanosis or edema  Pulses: 2+ and symmetric  Neurologic: Grossly normal  Adult ECG Report  Rate: 74 ;  Rhythm: normal sinus rhythm, premature atrial contractions (PAC), premature ventricular contractions (PVC) and CRO inferior infarct, age undetermined.  Narrative Interpretation: when compared to April 2015, PVCs appear more frequent, and the PACs are new.  Recent Labs:     Checked by PCP. Not currently  available.  07/2014 -- P2Y12 Assay - 77   ASSESSMENT / PLAN: CAD S/P PCI to Cx (Xience Alpine DES 3.5 mm x 23 mm - 3.75 mm); residual 40% RCA, 80% Dx; small OM 80% No active angina symptoms. Converted from Coumadin to Plavix with no bleeding issues. He is on statin plus beta blocker -- however he's only been taking the beta blocker once daily. I will increase this to twice a day. He will need to monitor for signs of hypotension. He as plenty of heart rate room, but not much blood pressure. Okay to stop aspirin  Essential hypertension Stable blood pressure. I don't have room to add ACE inhibitor/ARB therapy until we see how his blood pressure looks like with BID metoprolol.  - we may need to switch him to Toprol  Dyslipidemia, goal LDL below 70 His lipids are followed by his PCP. He is on high-dose statin.  While it is important that he reaches his LDL goal, we also need to be cognizant of his memory loss in the setting of statin.  Memory loss or impairment It is  close to his lipid control, I would strongly consider reducing his atorvastatin dose to 40-20 mg to avoid potential statin related memory issues.  Diastolic dysfunction, grade 1 with EF 55-60 % 11/24/13 No active heart failure symptoms.  Not requiring diuretic. Blood pressure is well-controlled without the ACE inhibitor/ARB.  Diabetes mellitus type II, uncontrolled By his report, A1c is down to 6 on oral medications. Well controlled.    Orders Placed This Encounter  Procedures  . EKG 12-Lead    New meds Meds ordered this encounter  Medications  . donepezil (ARICEPT) 5 MG tablet    Sig: Take 5 mg by mouth.     Followup: 6 month   HARDING,DAVID W, M.D., M.S. Interventional Cardiologist   Pager # [phone]

## 2014-11-10 NOTE — Patient Instructions (Signed)
PLEASE TAKE METOPROLOL 50 MG TWICE A DAY  PLEASE CONTACT OFFICE -CONCERNING SURGERY   Your physician wants you to follow-up in 6 MONTH DR Herbie BaltimoreHarding.  You will receive a reminder letter in the mail two months in advance. If you don't receive a letter, please call our office to schedule the follow-up appointment.

## 2014-11-11 ENCOUNTER — Encounter: Payer: Self-pay | Admitting: Cardiology

## 2014-11-11 DIAGNOSIS — R413 Other amnesia: Secondary | ICD-10-CM | POA: Insufficient documentation

## 2014-11-11 NOTE — Assessment & Plan Note (Signed)
No active angina symptoms. Converted from Coumadin to Plavix with no bleeding issues. He is on statin plus beta blocker -- however he's only been taking the beta blocker once daily. I will increase this to twice a day. He will need to monitor for signs of hypotension. He as plenty of heart rate room, but not much blood pressure. Okay to stop aspirin

## 2014-11-11 NOTE — Assessment & Plan Note (Signed)
No active heart failure symptoms.  Not requiring diuretic. Blood pressure is well-controlled without the ACE inhibitor/ARB.

## 2014-11-11 NOTE — Assessment & Plan Note (Signed)
His lipids are followed by his PCP. He is on high-dose statin.  While it is important that he reaches his LDL goal, we also need to be cognizant of his memory loss in the setting of statin.

## 2014-11-11 NOTE — Assessment & Plan Note (Signed)
It is close to his lipid control, I would strongly consider reducing his atorvastatin dose to 40-20 mg to avoid potential statin related memory issues.

## 2014-11-11 NOTE — Assessment & Plan Note (Signed)
Stable blood pressure. I don't have room to add ACE inhibitor/ARB therapy until we see how his blood pressure looks like with BID metoprolol.  - we may need to switch him to Toprol

## 2014-11-11 NOTE — Assessment & Plan Note (Signed)
By his report, A1c is down to 6 on oral medications. Well controlled.

## 2014-11-27 ENCOUNTER — Encounter (HOSPITAL_COMMUNITY): Payer: Self-pay | Admitting: Cardiology

## 2014-12-30 IMAGING — CR DG CHEST 2V
2 series · 2 of 2 positions shown · non-contrast
Comparison: Chest x-ray of May 11, 2010.

CLINICAL DATA: History of reason MI, also history of uncontrolled
diabetes and hyperlipidemia

[w chest pa]
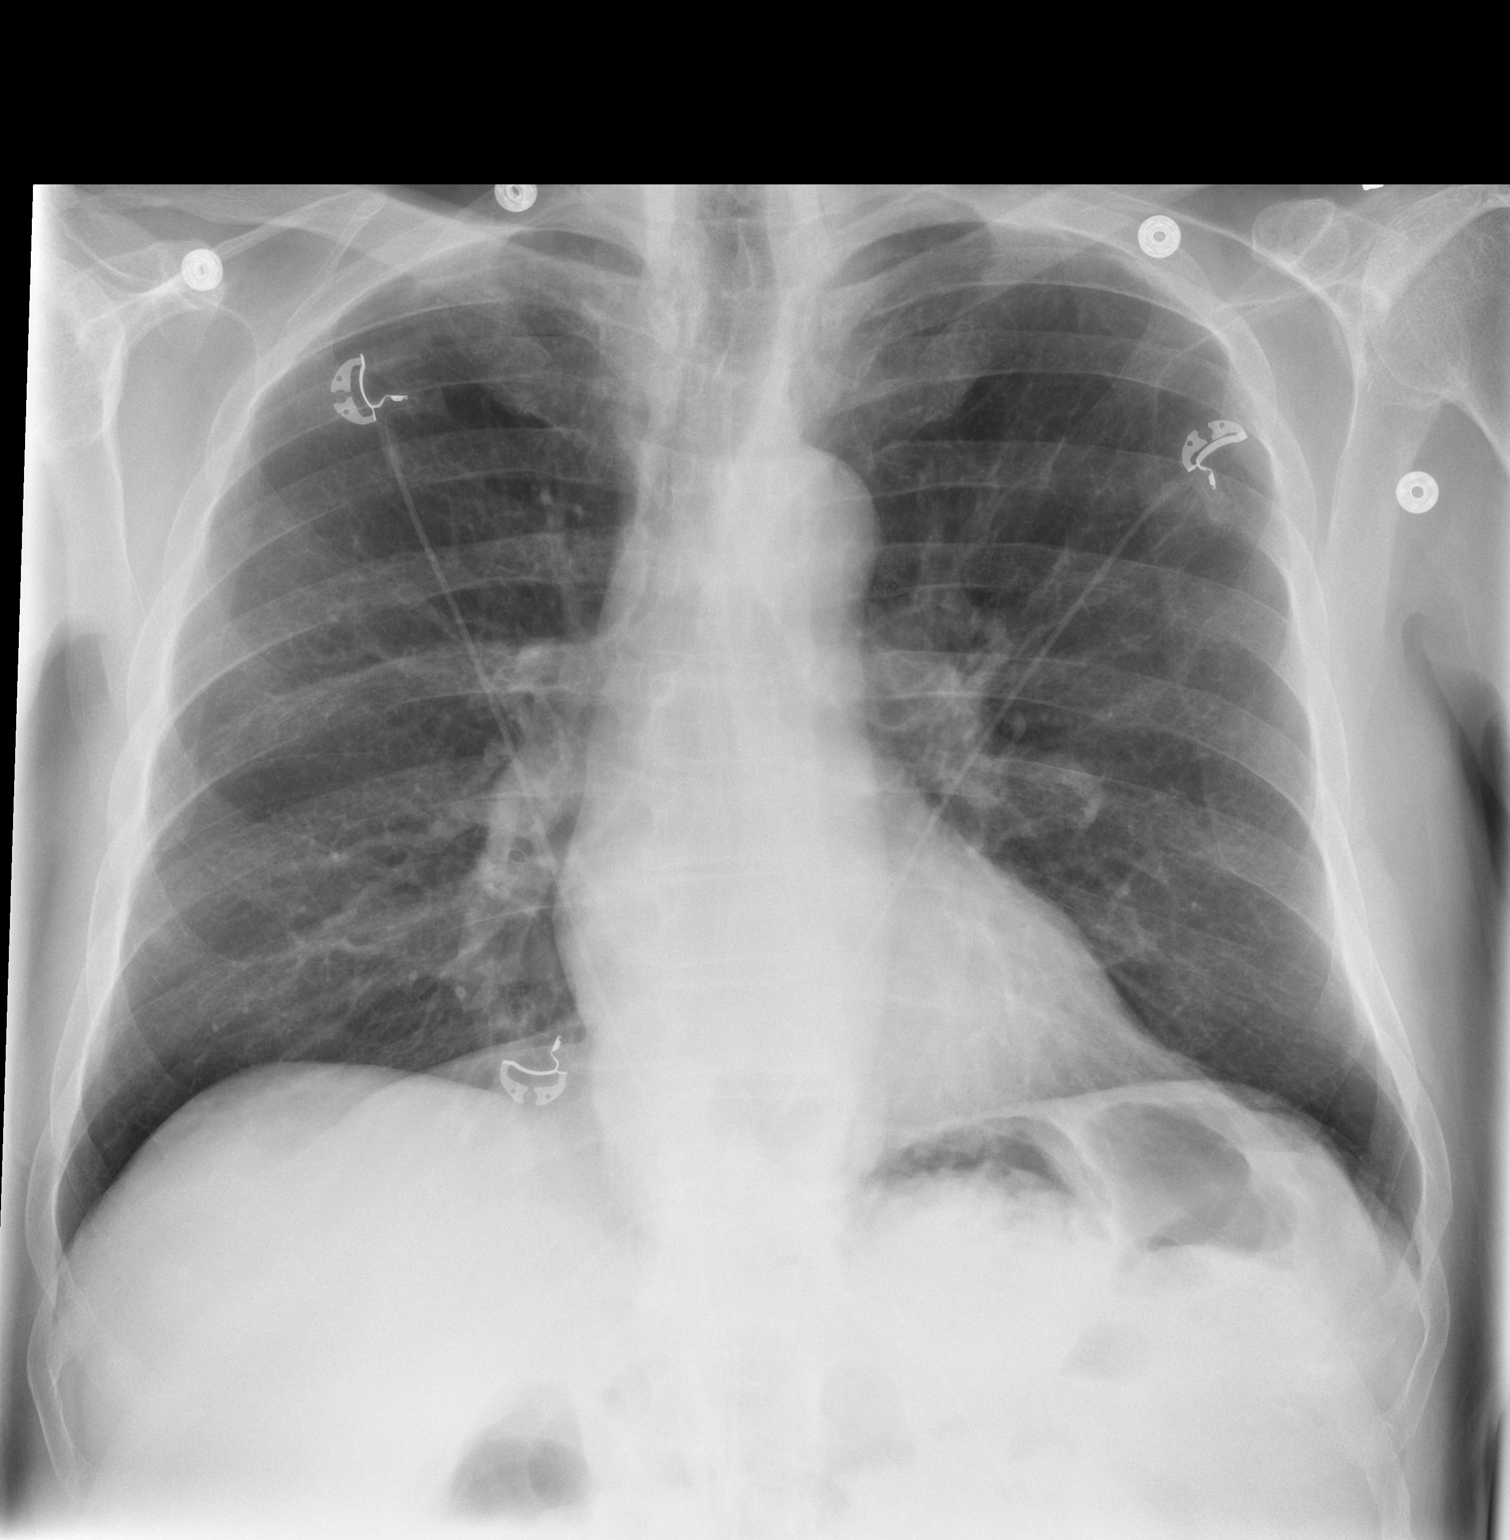

[w chest lat]
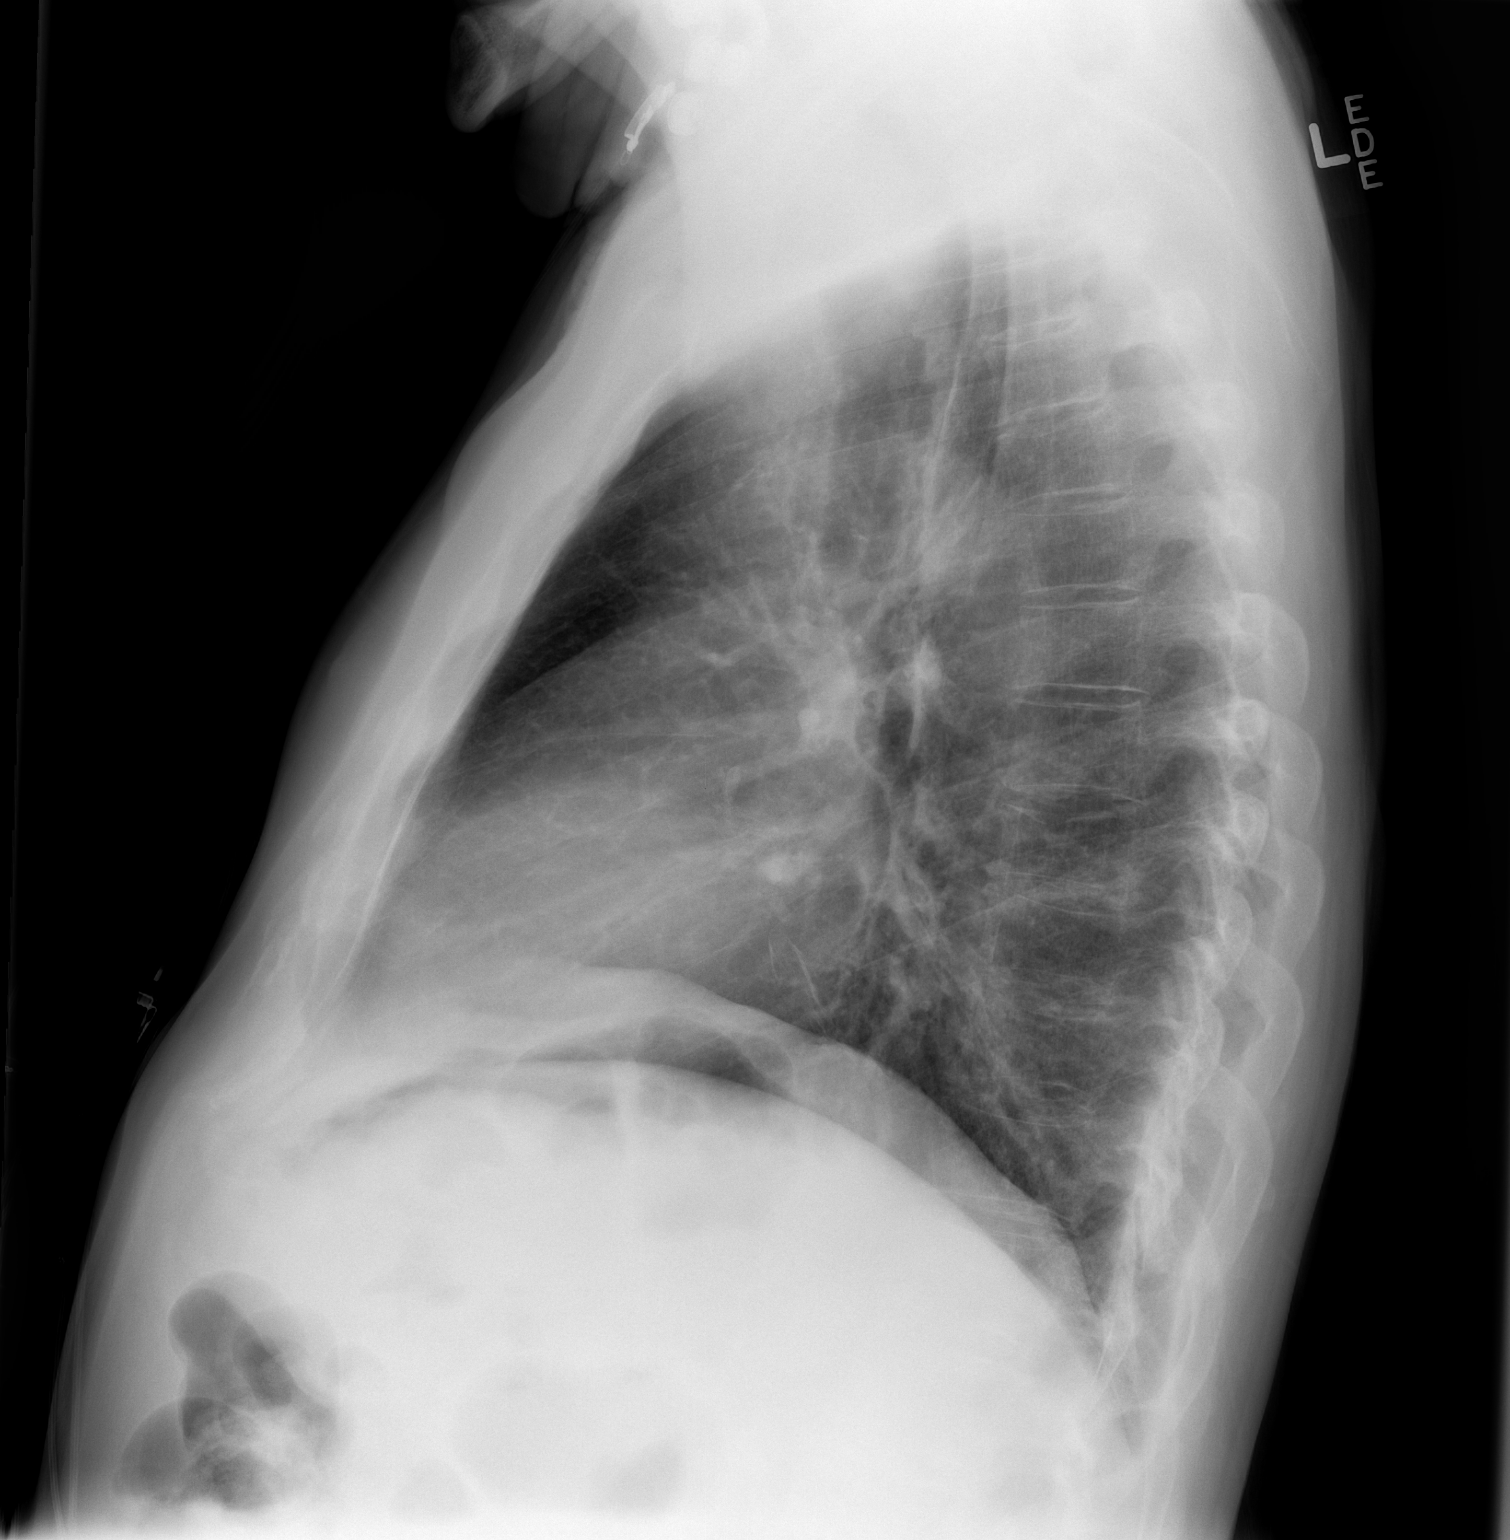

[2 of 2 positions shown; findings below may reference images not displayed]

FINDINGS: The lungs are adequately inflated. There is no focal infiltrate. The
cardiac silhouette is normal in size. The mediastinum is normal in
width. There is no pleural effusion. There is no evidence of a
pneumothorax.
IMPRESSION: There is no evidence of CHF nor other acute cardiopulmonary
abnormality.

EXAM:
CHEST  2 VIEW

## 2015-01-01 ENCOUNTER — Other Ambulatory Visit: Payer: Self-pay

## 2015-01-01 MED ORDER — METOPROLOL TARTRATE 50 MG PO TABS: 50.0000 mg | ORAL_TABLET | Freq: Two times a day (BID) | ORAL | Status: DC

## 2015-01-01 NOTE — Telephone Encounter (Signed)
Rx sent to pharmacy   

## 2015-01-07 ENCOUNTER — Telehealth: Payer: Self-pay | Admitting: Cardiology

## 2015-01-07 MED ORDER — NITROGLYCERIN 0.4 MG SL SUBL: 0.4000 mg | SUBLINGUAL_TABLET | SUBLINGUAL | Status: DC | PRN

## 2015-01-07 NOTE — Telephone Encounter (Signed)
Pt need a new prescription for nitroglycerin. Please call to [phone]

## 2015-01-07 NOTE — Telephone Encounter (Signed)
Rx(s) sent to pharmacy electronically. Spoke with Lafonda MossesDiana - patient is not having chest pain

## 2015-03-10 ENCOUNTER — Telehealth: Payer: Self-pay | Admitting: Cardiology

## 2015-03-11 NOTE — Telephone Encounter (Signed)
Close encounter 

## 2015-05-25 ENCOUNTER — Telehealth: Payer: Self-pay | Admitting: *Deleted

## 2015-05-25 NOTE — Telephone Encounter (Signed)
Spoke to patient and wife  Willling to switch appointment to 06/02/15 at 10:15 AM for RN Appointment will be change.

## 2015-05-26 ENCOUNTER — Ambulatory Visit: Payer: Self-pay | Admitting: Cardiology

## 2015-06-02 ENCOUNTER — Ambulatory Visit (INDEPENDENT_AMBULATORY_CARE_PROVIDER_SITE_OTHER): Payer: Medicare Other | Admitting: Cardiology

## 2015-06-02 ENCOUNTER — Encounter: Payer: Self-pay | Admitting: Cardiology

## 2015-06-02 VITALS — BP 134/66 | HR 64 | Ht 70.0 in | Wt 182.2 lb

## 2015-06-02 DIAGNOSIS — E785 Hyperlipidemia, unspecified: Secondary | ICD-10-CM | POA: Diagnosis not present

## 2015-06-02 DIAGNOSIS — I519 Heart disease, unspecified: Secondary | ICD-10-CM | POA: Diagnosis not present

## 2015-06-02 DIAGNOSIS — I2510 Atherosclerotic heart disease of native coronary artery without angina pectoris: Secondary | ICD-10-CM | POA: Diagnosis not present

## 2015-06-02 DIAGNOSIS — Z9861 Coronary angioplasty status: Secondary | ICD-10-CM

## 2015-06-02 DIAGNOSIS — I2119 ST elevation (STEMI) myocardial infarction involving other coronary artery of inferior wall: Secondary | ICD-10-CM | POA: Diagnosis not present

## 2015-06-02 DIAGNOSIS — I5189 Other ill-defined heart diseases: Secondary | ICD-10-CM

## 2015-06-02 DIAGNOSIS — I10 Essential (primary) hypertension: Secondary | ICD-10-CM | POA: Diagnosis not present

## 2015-06-02 MED ORDER — METOPROLOL TARTRATE 50 MG PO TABS: 25.0000 mg | ORAL_TABLET | Freq: Two times a day (BID) | ORAL | Status: DC

## 2015-06-02 NOTE — Progress Notes (Signed)
PCP: Lesia Hausen, MD  Clinic Note: Chief Complaint  Patient presents with  . Follow-up    48month follow;  no chest pain, no shortness of breath, no edema, no pain in legs, once in a while has cramping in legs, no lightheadedness, dizziness-had 1 spell last week, 1 time only  . Coronary Artery Disease    history of MI    HPI: Melvin Morrow is a 74 y.o. male with a PMH below who presents today for six-month followup of coronary disease status post inferior STEMI.  Last seen in November 2015 -  Had no complaints.  He was told to stop his aspirin, has not. Since his last visit his statin was changed from atorvastatin to pravastatin. This was partially related to memory loss and myalgias.   Past Medical History  Diagnosis Date  . ST elevation myocardial infarction (STEMI) of inferior wall, subsequent episode of care 11/24/2013    Occluded L Cx -- PCI with DES  . CAD S/P percutaneous coronary angioplasty 12/7/20154    Cx PCI: Xience Alpine 3.5 mm x 23 mm (3.75 mm); distal OM branch 95%; Small OM-ostial 80%, small Diag ~80%  . Diabetes mellitus type 2 in nonobese     Poorly controlled  . Hyperlipidemia   . AAA (abdominal aortic aneurysm)   . Ischiorectal abscess   . Nephrolithiasis   . Arthritis   . Seizure disorder     Distant past  . Essential hypertension     Prior Cardiac Evaluation & Procedural History: Procedure Laterality Date  . Percutaneous coronary stent intervention (pci-s)  11/24/2013    Subtotalled Cx - PCI with Xience Alpine DES 3.5 mm x 23 mm ( 3.75 mm)  . Cardiac catheterization  11/24/2013    Subtotal occluded mid circumflex; distal branch of lateral OM 95%; small OM1 80% ostial, small diagonal 180% ostial.  . Transthoracic echocardiogram  11/24/2013    EF 55-60%. Mild hypokinesis of the mid--distal inferior lateral wall. Grade 1 diastolic dysfunction. Elevated LAP; no other significant findings. Mild aortic valve calcification .   Interval History: Doing  fine.  He does "what he can" from an activity standpoint.  Works in yard & garden a little. Doesn't walk / exercise "as much as he should".  A little slow going up stairs or up hills 2/2 arthritis.  1 day of lightheaded.    He presents today really without any major complaints. For some reason, his metoprolol Rx was only refilled for 30 tab - i.e only taking it once daily. He has denied any sensation of chest tightness or pressure or, dyspnea with rest or exertion.  No CHF findings ofPND, orthopnea or edema.  He has had some intermittent palpitations, but denies any lightheadedness, dizziness, weakness, syncope/near syncope, orTIA/amaurosis fugax symptoms.  He was recently started on Namenda in addition to Aricept for concerns of possible dementia symptoms. His PCP is following his lipids and was told "they were good last visit. He also noted to have his A1c by recent check was roughly 7.1.  ROS: A comprehensive was performed. Review of Systems  Constitutional: Negative for weight loss and malaise/fatigue.  HENT: Negative for congestion and nosebleeds.   Respiratory: Negative for cough, sputum production and wheezing.   Cardiovascular: Negative for claudication.  Gastrointestinal: Negative for blood in stool and melena.  Genitourinary: Negative for hematuria.  Musculoskeletal: Positive for joint pain. Negative for falls.  Neurological: Negative for dizziness, sensory change, speech change, focal weakness, seizures and loss of consciousness.  Endo/Heme/Allergies: Bruises/bleeds easily.  Psychiatric/Behavioral: Positive for memory loss. Negative for depression. The patient is not nervous/anxious and does not have insomnia.   All other systems reviewed and are negative.   Current Outpatient Prescriptions on File Prior to Visit  Medication Sig Dispense Refill  . acetaminophen (TYLENOL) 325 MG tablet Take 2 tablets (650 mg total) by mouth every 4 (four) hours as needed for headache or mild pain.     Marland Kitchen clopidogrel (PLAVIX) 75 MG tablet Take 1 tablet (75 mg total) by mouth daily. 90 tablet 3  . donepezil (ARICEPT) 5 MG tablet Take 5 mg by mouth.    . fluticasone (FLONASE) 50 MCG/ACT nasal spray as needed.    . glyBURIDE-metformin (GLUCOVANCE) 5-500 MG per tablet Take 2 tablets by mouth 2 (two) times daily with a meal.     . nitroGLYCERIN (NITROSTAT) 0.4 MG SL tablet Place 1 tablet (0.4 mg total) under the tongue every 5 (five) minutes as needed for chest pain. 25 tablet 3  . omeprazole (PRILOSEC) 20 MG capsule Take 20 mg by mouth daily.    . phenytoin (DILANTIN) 100 MG ER capsule Take 200-300 mg by mouth 2 (two) times daily. Take 2 caps ( 200mg ) in the morning and 3 caps ( 300mg ) at bedtime    . atorvastatin (LIPITOR) 80 MG tablet Take 1 tablet (80 mg total) by mouth daily at 6 PM. (Patient not taking: Reported on 06/02/2015) 30 tablet 11   No current facility-administered medications on file prior to visit.  NOT TAKING METOPROLOL CORRECTLY - IS TAKING 50 MG DAILY (NOT BID) CONVERTED FROM ATORVASTATIN TO PRAVASTATIN - HAD MYALGIAS (PCP  Follows levels)  Allergies  Allergen Reactions  . Sulfur    History  Substance Use Topics  . Smoking status: Former Smoker    Types: Cigarettes  . Smokeless tobacco: Never Used     Comment: Quit 25 years ago.   . Alcohol Use: No   Family History  Problem Relation Age of Onset  . CAD Father     Wt Readings from Last 3 Encounters:  06/02/15 82.645 kg (182 lb 3.2 oz)  11/10/14 84.505 kg (186 lb 4.8 oz)  03/31/14 83.779 kg (184 lb 11.2 oz)    PHYSICAL EXAM BP 134/66 mmHg  Pulse 64  Ht 5\' 10"  (1.778 m)  Wt 82.645 kg (182 lb 3.2 oz)  BMI 26.14 kg/m2 General appearance: alert, cooperative, appears stated age, no distress and pleasant mood and affect, healthy-appearing  HEENT: [place]/AT, EOMI, MMM, anicteric sclera Neck: no adenopathy, no carotid bruit, no JVD, supple, symmetrical, trachea midline and thyroid not enlarged, symmetric, no  tenderness/mass/nodules  Lungs: CTAB, normal percussion bilaterally and Non-labored, good air movement  Heart: RRR, S1, S2 normal, no murmur, click, rub or gallop and normal apical impulse  Abdomen: soft, non-tender; bowel sounds normal; no masses, no organomegaly  Extremities: extremities normal, atraumatic, no cyanosis or edema  Pulses: 2+ and symmetric  Neurologic: Grossly normal  Adult ECG Report  Rate: 64 ;  Rhythm: normal sinus rhythm and otherwise normal  Narrative Interpretation:stable, normal - no PAC or PVCs .  Recent Labs:     Checked by PCP. Not currently available.  ASSESSMENT / PLAN: Problem List Items Addressed This Visit    CAD S/P PCI to Cx (Xience Alpine DES 3.5 mm x 23 mm - 3.75 mm); residual 40% RCA, 80% Dx; small OM 80% - Primary (Chronic)    No active angina or heart failure symptoms. Now taking Plavix.  Okay to stop aspirin. On statin albeit changed. On beta blocker which we will switch from 50 once a day as he is taking up to 25 mg twice a day.      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 We are fine without afterload reduction at this time.      Dyslipidemia, goal LDL below 70 (Chronic)    Switch from high dose of lovastatin now on pravastatin. Monitored by PCP. He still has some memory loss issues but less likely to be exacerbated by pravastatin.      Relevant Medications   pravastatin (PRAVACHOL) 80 MG tablet   metoprolol (LOPRESSOR) 50 MG tablet   Other Relevant Orders   EKG 12-Lead (Completed)   Essential hypertension (Chronic)    Stable blood pressure. We are trying to avoid hypotension therefore we'll not add an ACE inhibitor AB. Switching to metoprolol to twice a day she not adversely affect blood pressure.      Relevant Medications    pravastatin (PRAVACHOL) 80 MG tablet   metoprolol (LOPRESSOR) 50 MG tablet   Other Relevant Orders   EKG 12-Lead (Completed)   STEMI 11/24/13-s/p PCI + DES to CFX  (Chronic)    Preserved EF on echo. No regional wall motion abnormality. Activity is limited by arthritis pains. No heart failure symptoms      Relevant Medications   pravastatin (PRAVACHOL) 80 MG tablet   metoprolol (LOPRESSOR) 50 MG tablet       Orders Placed This Encounter  Procedures  . EKG 12-Lead    New meds    Followup: 6 month   HARDING, Piedad Climes, M.D., M.S. Interventional Cardiologist   Pager # [phone]

## 2015-06-02 NOTE — Patient Instructions (Addendum)
START 1/2 TABLET   OF  50 MGMETOPROLOL  TART.  ( EQUAL 25 MG) TWICE A DAY  STOP TAKING ASPIRIN 81 MG  CONTINUE ALL OTHER MEDICATIONS   Your physician wants you to follow-up in 6 MONTH DR HARDING. You will receive a reminder letter in the mail two months in advance. If you don't receive a letter, please call our office to schedule the follow-up appointment.

## 2015-06-04 ENCOUNTER — Encounter: Payer: Self-pay | Admitting: Cardiology

## 2015-06-04 NOTE — Assessment & Plan Note (Signed)
Preserved EF on echo. No regional wall motion abnormality. Activity is limited by arthritis pains. No heart failure symptoms

## 2015-06-04 NOTE — Assessment & Plan Note (Signed)
Switch from high dose of lovastatin now on pravastatin. Monitored by PCP. He still has some memory loss issues but less likely to be exacerbated by pravastatin.

## 2015-06-04 NOTE — Assessment & Plan Note (Signed)
No active angina or heart failure symptoms. Now taking Plavix. Okay to stop aspirin. On statin albeit changed. On beta blocker which we will switch from 50 once a day as he is taking up to 25 mg twice a day.

## 2015-06-04 NOTE — Assessment & Plan Note (Addendum)
No heart failure symptoms. We are fine without afterload reduction at this time.

## 2015-06-04 NOTE — Assessment & Plan Note (Signed)
Stable blood pressure. We are trying to avoid hypotension therefore we'll not add an ACE inhibitor AB. Switching to metoprolol to twice a day she not adversely affect blood pressure.

## 2015-08-20 ENCOUNTER — Other Ambulatory Visit: Payer: Self-pay | Admitting: *Deleted

## 2015-08-20 MED ORDER — CLOPIDOGREL BISULFATE 75 MG PO TABS: 75.0000 mg | ORAL_TABLET | Freq: Every day | ORAL | Status: DC

## 2015-12-24 ENCOUNTER — Ambulatory Visit (INDEPENDENT_AMBULATORY_CARE_PROVIDER_SITE_OTHER): Payer: Medicare Other | Admitting: Cardiology

## 2015-12-24 ENCOUNTER — Encounter: Payer: Self-pay | Admitting: Cardiology

## 2015-12-24 VITALS — BP 118/66 | HR 62 | Wt 183.9 lb

## 2015-12-24 DIAGNOSIS — E785 Hyperlipidemia, unspecified: Secondary | ICD-10-CM | POA: Diagnosis not present

## 2015-12-24 DIAGNOSIS — I10 Essential (primary) hypertension: Secondary | ICD-10-CM

## 2015-12-24 DIAGNOSIS — I519 Heart disease, unspecified: Secondary | ICD-10-CM

## 2015-12-24 DIAGNOSIS — I5189 Other ill-defined heart diseases: Secondary | ICD-10-CM

## 2015-12-24 DIAGNOSIS — I2510 Atherosclerotic heart disease of native coronary artery without angina pectoris: Secondary | ICD-10-CM | POA: Diagnosis not present

## 2015-12-24 DIAGNOSIS — I2119 ST elevation (STEMI) myocardial infarction involving other coronary artery of inferior wall: Secondary | ICD-10-CM

## 2015-12-24 DIAGNOSIS — Z9861 Coronary angioplasty status: Secondary | ICD-10-CM

## 2015-12-24 DIAGNOSIS — R413 Other amnesia: Secondary | ICD-10-CM

## 2015-12-24 MED ORDER — NITROGLYCERIN 0.4 MG SL SUBL: 0.4000 mg | SUBLINGUAL_TABLET | SUBLINGUAL | Status: DC | PRN

## 2015-12-24 NOTE — Progress Notes (Signed)
PCP: Lesia Hausen, MD  Clinic Note: Chief Complaint  Patient presents with  . Follow-up    6 month//CAD//STEMI//pt states no Sx.//wants to know how long he will stay on Plavix, and also about getting 2 handicap signs    HPI: Melvin Morrow is a 75 y.o. male with a PMH below who presents today for ~ 6 month f/u for CAD (h/o Inf STEMI).  Asa Baudoin was last seen in June 2016 - was doing well.   ASA stopped Changed from Atorvastatin to Pravastatin due to myalgias & memory issues PCP started Namenda + Aricept for ? Dementia.  Recent Hospitalizations: none  Studies Reviewed: none  Interval History: Dain presents today doing relatively well. He does not have any significant symptoms of chest tightness or pressure with rest or exertion. He is not overly active but does do routine activities of daily living around the house. He really is unable due to much more than that because of balance issues. In the summertime he works a little bit in the garden. Mostly limited by arthritis pains. He spends a lot of time sleeping. Will fall sleep very easily.  No chest pain or shortness of breath with rest or exertion.  No PND, orthopnea or edema. No palpitations, lightheadedness, dizziness, weakness or syncope/near syncope. He does have poor balance and difficulty with walking. He should walk  with a walker or cane. No TIA/amaurosis fugax symptoms. No claudication.  ROS: A comprehensive was performed. Review of Systems  Constitutional: Negative for malaise/fatigue.       Sleepy  Respiratory: Negative.   Cardiovascular: Negative for leg swelling.  Gastrointestinal: Negative for blood in stool and melena.  Genitourinary: Negative for hematuria.  Musculoskeletal: Positive for falls (With poor balance). Negative for myalgias (Much less notable with changing of statin.).  Neurological: Negative for headaches.  Endo/Heme/Allergies: Bruises/bleeds easily.  Psychiatric/Behavioral:  Positive for memory loss.       Easily somnolence  All other systems reviewed and are negative.   Past Medical History  Diagnosis Date  . ST elevation myocardial infarction (STEMI) of inferior wall, subsequent episode of care (HCC) 11/24/2013    Occluded L Cx -- PCI with DES  . CAD S/P percutaneous coronary angioplasty 11/24/2013    Cx PCI: Xience Alpine 3.5 mm x 23 mm (3.75 mm); distal OM branch 95%; Small OM-ostial 80%, small Diag ~80%  . Diabetes mellitus type 2 in nonobese (HCC)     Poorly controlled  . Hyperlipidemia   . AAA (abdominal aortic aneurysm) (HCC)   . Ischiorectal abscess   . Nephrolithiasis   . Arthritis   . Seizure disorder (HCC)     Distant past  . Essential hypertension     Past Surgical History  Procedure Laterality Date  . Anal fissure repair    . Percutaneous coronary stent intervention (pci-s)  11/24/2013    Subtotalled Cx - PCI with Xience Alpine DES 3.5 mm x 23 mm ( 3.75 mm)  . Cardiac catheterization  11/24/2013    Subtotal occluded mid circumflex; distal branch of lateral OM 95%; small OM1 80% ostial, small diagonal 180% ostial.  . Transthoracic echocardiogram  11/24/2013    EF 55-60%. Mild hypokinesis of the mid--distal inferior lateral wall. Grade 1 diastolic dysfunction. Elevated LAP; no other significant findings. Mild aortic valve calcification .  Marland Kitchen Left heart catheterization with coronary angiogram N/A 11/24/2013    Procedure: LEFT HEART CATHETERIZATION WITH CORONARY ANGIOGRAM;  Surgeon: Marykay Lex, MD;  Location: [facility] CATH LAB;  Service: Cardiovascular;  Laterality: N/A;   Prior to Admission medications   Medication Sig Start Date End Date Taking? Authorizing Provider  acetaminophen (TYLENOL) 325 MG tablet Take 2 tablets (650 mg total) by mouth every 4 (four) hours as needed for headache or mild pain. 11/27/13   Abelino DerrickLuke K Kilroy, PA-C  atorvastatin (LIPITOR) 80 MG tablet Take 1 tablet (80 mg total) by mouth daily at 6 PM. Patient not taking:  Reported on 06/02/2015 01/06/14   Marykay Lexavid W Sabastien Tyler, MD  azelastine (ASTELIN) 0.1 % nasal spray Place 2 sprays into the nose. Use in each nostril as directed 05/15/15 05/14/16  Historical Provider, MD  clopidogrel (PLAVIX) 75 MG tablet Take 1 tablet (75 mg total) by mouth daily. 08/20/15   Marykay Lexavid W Raye Wiens, MD  donepezil (ARICEPT) 5 MG tablet Take 5 mg by mouth. 10/23/14 10/23/15  Historical Provider, MD  fluticasone (FLONASE) 50 MCG/ACT nasal spray as needed. 02/25/14   Historical Provider, MD  glyBURIDE-metformin (GLUCOVANCE) 5-500 MG per tablet Take 2 tablets by mouth 2 (two) times daily with a meal.  10/14/13   Historical Provider, MD  memantine [facility](NAMENDA TITRATION PAK) tablet pack Use as diredted 05/25/15 05/24/16  Historical Provider, MD  metoprolol (LOPRESSOR) 50 MG tablet Take 0.5 tablets (25 mg total) by mouth 2 (two) times daily. 06/02/15   Marykay Lexavid W Darious Rehman, MD  nitroGLYCERIN (NITROSTAT) 0.4 MG SL tablet Place 1 tablet (0.4 mg total) under the tongue every 5 (five) minutes as needed for chest pain. 01/07/15   Marykay Lexavid W Tate Jerkins, MD  omeprazole (PRILOSEC) 20 MG capsule Take 20 mg by mouth daily.    Historical Provider, MD  phenytoin (DILANTIN) 100 MG ER capsule Take 200-300 mg by mouth 2 (two) times daily. Take 2 caps ( 200mg ) in the morning and 3 caps ( 300mg ) at bedtime    Historical Provider, MD  pravastatin (PRAVACHOL) 80 MG tablet Take 80 mg by mouth daily.    Historical Provider, MD  traMADol (ULTRAM) 50 MG tablet Take 1 tablet by mouth 4 (four) times daily as needed. 05/07/15   Historical Provider, MD   Allergies  Allergen Reactions  . Sulfur      Social History   Social History  . Marital Status: Married    Spouse Name: N/A  . Number of Children: N/A  . Years of Education: N/A   Social History Main Topics  . Smoking status: Former Smoker    Types: Cigarettes  . Smokeless tobacco: Never Used     Comment: Quit 25 years ago.   . Alcohol Use: No  . Drug Use: No  . Sexual Activity: No    Other Topics Concern  . None   Social History Narrative   Married. [place]   Former smoker, quit 25 years ago;   He opted out of cardiac rehabilitation, states that he walks "when he can. No routine exercise.   Family History  Problem Relation Age of Onset  . CAD Father     Wt Readings from Last 3 Encounters:  12/24/15 183 lb 14.4 oz (83.416 kg)  06/02/15 182 lb 3.2 oz (82.645 kg)  11/10/14 186 lb 4.8 oz (84.505 kg)    PHYSICAL EXAM BP 118/66 mmHg  Pulse 62  Wt 183 lb 14.4 oz (83.416 kg) General appearance: alert, cooperative, appears stated age, no distress and pleasant mood and affect, healthy-appearing  HEENT: [place]/AT, EOMI, MMM, anicteric sclera Neck: no adenopathy, no carotid bruit, no JVD, supple, symmetrical, trachea midline and  thyroid not enlarged, symmetric, no tenderness/mass/nodules  Lungs: CTAB, normal percussion bilaterally and Non-labored, good air movement  Heart: RRR, S1, S2 normal, no murmur, click, rub or gallop and normal apical impulse  Abdomen: soft, non-tender; bowel sounds normal; no masses, no organomegaly  Extremities: extremities normal, atraumatic, no cyanosis or edema  Pulses: 2+ and symmetric  Neurologic: Grossly normal   Adult ECG Report  Rate: 62  Rhythm: normal sinus rhythm and LVH voltage criteria; Inferior MI, age undertermined.  Normal axis & intervals; Inferior TWI constider ischemia  Narrative Interpretation: TWI are new - otherwise no changes  Other studies Reviewed: Additional studies/ records that were reviewed today include:  Recent Labs:  - recent check by PCP Lab Results  Component Value Date   CHOL 205* 11/24/2013   HDL 41 11/24/2013   LDLCALC 118* 11/24/2013   TRIG 231* 11/24/2013   CHOLHDL 5.0 11/24/2013     ASSESSMENT / PLAN: Problem List Items Addressed This Visit    STEMI 11/24/13-s/p PCI + DES to CFX  (Chronic)    He definitely has preserved wall motion and EF by echocardiogram, indicating  that he should've recovered significantly from his MI. At baseline of think he is quite debilitated.      Relevant Medications   nitroGLYCERIN (NITROSTAT) 0.4 MG SL tablet   Other Relevant Orders   EKG 12-Lead   Memory loss or impairment (Chronic)   Relevant Orders   EKG 12-Lead   Essential hypertension (Chronic)    Stable blood pressure. Trying to avoid hypotension. Would not increase beyond existing dose of metoprolol.      Relevant Medications   nitroGLYCERIN (NITROSTAT) 0.4 MG SL tablet   Other Relevant Orders   EKG 12-Lead   Dyslipidemia, goal LDL below 70 (Chronic)    He is now on Pravachol area noting less myalgias and less memory issues. Due for follow-up by PCP.      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 He could have some exertional dyspnea, but otherwise is doing relatively well from a cardiac standpoint. No need for diuretic.      Relevant Orders   EKG 12-Lead   CAD S/P PCI to Cx (Xience Alpine DES 3.5 mm x 23 mm - 3.75 mm); residual 40% RCA, 80% Dx; small OM 80% - Primary (Chronic)    No active anginal symptoms or heart failure symptoms. He asked about how much he has take Plavix. 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 Tolerating changes statin. We will change his beta blocker to 25 mg twice a day. Tolerating it well. Needs to continue to try to stay active, if necessary use a walker.      Relevant Medications   nitroGLYCERIN (NITROSTAT) 0.4 MG SL tablet   Other Relevant Orders   EKG 12-Lead      Current medicines are reviewed at length with the patient today. (+/- concerns) - question about Plavix. Noted above. The following changes have been made: None   Studies Ordered:   Orders  Placed This Encounter  Procedures  . EKG 12-Lead   ROV - 6 months   Taci Sterling, Piedad Climes, M.D., M.S. Interventional Cardiologist   Pager # [phone]

## 2015-12-24 NOTE — Patient Instructions (Signed)
NO CHANGE IN CURRENT MEDICATIONS    Your physician wants you to follow-up in 6 MONTHS WITH Dr Herbie BaltimoreHARDING.  You will receive a reminder letter in the mail two months in advance. If you don't receive a letter, please call our office to schedule the follow-up appointment.    If you need a refill on your cardiac medications before your next appointment, please call your pharmacy.

## 2015-12-26 ENCOUNTER — Encounter: Payer: Self-pay | Admitting: Cardiology

## 2015-12-26 NOTE — Assessment & Plan Note (Signed)
He definitely has preserved wall motion and EF by echocardiogram, indicating that he should've recovered significantly from his MI. At baseline of think he is quite debilitated.

## 2015-12-26 NOTE — Assessment & Plan Note (Signed)
Stable blood pressure. Trying to avoid hypotension. Would not increase beyond existing dose of metoprolol.

## 2015-12-26 NOTE — Assessment & Plan Note (Signed)
No active heart failure symptoms. He could have some exertional dyspnea, but otherwise is doing relatively well from a cardiac standpoint. No need for diuretic.

## 2015-12-26 NOTE — Assessment & Plan Note (Signed)
No active anginal symptoms or heart failure symptoms. He asked about how much he has take Plavix. 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 Tolerating changes statin. We will change his beta blocker to 25 mg twice a day. Tolerating it well. Needs to continue to try to stay active, if necessary use a walker.

## 2015-12-26 NOTE — Assessment & Plan Note (Signed)
He is now on Pravachol area noting less myalgias and less memory issues. Due for follow-up by PCP.

## 2015-12-28 ENCOUNTER — Other Ambulatory Visit: Payer: Self-pay | Admitting: *Deleted

## 2015-12-28 MED ORDER — METOPROLOL TARTRATE 50 MG PO TABS: 25.0000 mg | ORAL_TABLET | Freq: Two times a day (BID) | ORAL | Status: DC

## 2016-03-28 ENCOUNTER — Other Ambulatory Visit: Payer: Self-pay | Admitting: *Deleted

## 2016-03-28 MED ORDER — CLOPIDOGREL BISULFATE 75 MG PO TABS: 75.0000 mg | ORAL_TABLET | Freq: Every day | ORAL | Status: DC

## 2016-07-05 ENCOUNTER — Other Ambulatory Visit: Payer: Self-pay

## 2016-07-05 MED ORDER — METOPROLOL TARTRATE 50 MG PO TABS: 25.0000 mg | ORAL_TABLET | Freq: Two times a day (BID) | ORAL | Status: DC

## 2016-07-06 ENCOUNTER — Other Ambulatory Visit: Payer: Self-pay

## 2016-07-06 MED ORDER — CLOPIDOGREL BISULFATE 75 MG PO TABS: 75.0000 mg | ORAL_TABLET | Freq: Every day | ORAL | Status: DC

## 2016-07-06 NOTE — Telephone Encounter (Signed)
Medication Refill

## 2016-10-17 ENCOUNTER — Other Ambulatory Visit: Payer: Self-pay

## 2016-10-17 MED ORDER — CLOPIDOGREL BISULFATE 75 MG PO TABS: 75.0000 mg | ORAL_TABLET | Freq: Every day | ORAL | 0 refills | Status: DC

## 2016-10-17 NOTE — Telephone Encounter (Signed)
Rx(s) sent to pharmacy electronically.  

## 2017-01-25 ENCOUNTER — Other Ambulatory Visit: Payer: Self-pay | Admitting: Cardiology

## 2017-01-25 NOTE — Telephone Encounter (Signed)
Rx(s) sent to pharmacy electronically.  

## 2017-03-11 ENCOUNTER — Other Ambulatory Visit: Payer: Self-pay | Admitting: Cardiology

## 2017-03-13 NOTE — Telephone Encounter (Signed)
Rx(s) sent to pharmacy electronically.  

## 2017-03-30 ENCOUNTER — Other Ambulatory Visit: Payer: Self-pay | Admitting: Cardiology

## 2017-04-07 ENCOUNTER — Other Ambulatory Visit: Payer: Self-pay

## 2017-04-07 MED ORDER — NITROGLYCERIN 0.4 MG SL SUBL: 0.4000 mg | SUBLINGUAL_TABLET | SUBLINGUAL | 0 refills | Status: AC | PRN

## 2017-04-07 NOTE — Telephone Encounter (Signed)
Rx(s) sent to pharmacy electronically.  

## 2017-05-02 ENCOUNTER — Telehealth: Payer: Self-pay | Admitting: Cardiology

## 2017-05-02 MED ORDER — METOPROLOL TARTRATE 50 MG PO TABS: 25.0000 mg | ORAL_TABLET | Freq: Two times a day (BID) | ORAL | 2 refills | Status: AC

## 2017-05-02 MED ORDER — CLOPIDOGREL BISULFATE 75 MG PO TABS: 75.0000 mg | ORAL_TABLET | Freq: Every day | ORAL | 2 refills | Status: DC

## 2017-05-02 NOTE — Telephone Encounter (Signed)
s/w Isabella Bowensiana Layne(wife) she states that she needs refill for pts medication but pharmacy will not refill. Followup appt scheduled and medications refilled until appt

## 2017-05-02 NOTE — Telephone Encounter (Signed)
Wife wanted to know if pt still need to come in for office visit with Dr Herbie BaltimoreHarding. Pt she says is doing pretty good physically,but he is in the state of Dementia.

## 2017-07-04 ENCOUNTER — Ambulatory Visit: Payer: Medicare Other | Admitting: Cardiology

## 2017-08-10 ENCOUNTER — Ambulatory Visit: Payer: Medicare Other | Admitting: Cardiology

## 2017-09-01 ENCOUNTER — Other Ambulatory Visit: Payer: Self-pay | Admitting: Cardiology

## 2017-09-13 ENCOUNTER — Ambulatory Visit: Payer: Medicare Other | Admitting: Cardiology

## 2017-09-25 ENCOUNTER — Other Ambulatory Visit: Payer: Self-pay | Admitting: Cardiology

## 2017-10-04 ENCOUNTER — Ambulatory Visit: Payer: Medicare Other | Admitting: Cardiology

## 2017-10-27 ENCOUNTER — Ambulatory Visit: Payer: Medicare Other | Admitting: Cardiology

## 2019-05-20 DEATH — deceased
